# Patient Record
Sex: Male | Born: 1950 | Race: White | Hispanic: No | Marital: Married | Smoking: Never smoker
Health system: Southern US, Community
[De-identification: ages and names within clinical notes are randomized; demographics above are authoritative.]

## PROBLEM LIST (undated history)

## (undated) DIAGNOSIS — F32A Depression, unspecified: Secondary | ICD-10-CM

## (undated) DIAGNOSIS — F419 Anxiety disorder, unspecified: Secondary | ICD-10-CM

## (undated) DIAGNOSIS — I8393 Asymptomatic varicose veins of bilateral lower extremities: Secondary | ICD-10-CM

## (undated) DIAGNOSIS — C801 Malignant (primary) neoplasm, unspecified: Secondary | ICD-10-CM

## (undated) DIAGNOSIS — G709 Myoneural disorder, unspecified: Secondary | ICD-10-CM

## (undated) DIAGNOSIS — F329 Major depressive disorder, single episode, unspecified: Secondary | ICD-10-CM

## (undated) HISTORY — DX: Asymptomatic varicose veins of bilateral lower extremities: I83.93

## (undated) HISTORY — DX: Anxiety disorder, unspecified: F41.9

## (undated) HISTORY — DX: Major depressive disorder, single episode, unspecified: F32.9

## (undated) HISTORY — DX: Myoneural disorder, unspecified: G70.9

## (undated) HISTORY — DX: Malignant (primary) neoplasm, unspecified: C80.1

## (undated) HISTORY — DX: Depression, unspecified: F32.A

---

## 2015-08-07 ENCOUNTER — Ambulatory Visit: Payer: Self-pay | Admitting: Family Medicine

## 2015-08-11 ENCOUNTER — Encounter: Payer: Self-pay | Admitting: Family Medicine

## 2015-08-11 ENCOUNTER — Ambulatory Visit (INDEPENDENT_AMBULATORY_CARE_PROVIDER_SITE_OTHER): Payer: BC Managed Care – PPO | Admitting: Family Medicine

## 2015-08-11 VITALS — BP 140/88 | HR 65 | Ht 67.32 in | Wt 176.0 lb

## 2015-08-11 DIAGNOSIS — F32A Depression, unspecified: Secondary | ICD-10-CM | POA: Insufficient documentation

## 2015-08-11 DIAGNOSIS — F418 Other specified anxiety disorders: Secondary | ICD-10-CM

## 2015-08-11 DIAGNOSIS — N400 Enlarged prostate without lower urinary tract symptoms: Secondary | ICD-10-CM

## 2015-08-11 DIAGNOSIS — F329 Major depressive disorder, single episode, unspecified: Secondary | ICD-10-CM

## 2015-08-11 DIAGNOSIS — L819 Disorder of pigmentation, unspecified: Secondary | ICD-10-CM | POA: Diagnosis not present

## 2015-08-11 DIAGNOSIS — F419 Anxiety disorder, unspecified: Secondary | ICD-10-CM | POA: Insufficient documentation

## 2015-08-11 MED ORDER — ST JOHNS WORT 300 MG PO TABS: ORAL_TABLET | ORAL | Status: DC

## 2015-08-11 MED ORDER — TAMSULOSIN HCL 0.4 MG PO CAPS: 0.4000 mg | ORAL_CAPSULE | Freq: Every day | ORAL | Status: DC

## 2015-08-11 NOTE — Progress Notes (Signed)
CC: Marc Reynolds is a 65 y.o. male is here for Establish Care and Urinary Frequency   Subjective: HPI:  Very pleasant 73 old here to establish care.  Reports frustration with waking up 3 times a night to urinate. He has occasional urinary hesitancy that is worsened by Benadryl. He feels like he is urinating more than the average male his age. He denies dysuria or penile discharge. Symptoms have improved in the past with antibiotics but only for a few days.  He has a spot on his right medial thigh that he likely look at. It's been there for matter of years. It seems to be slowly enlarging and there are some blood vessels around the surface of the skin near by. He denies any pain or itching. No interventions as of yet. No one has examined this in the past. He denies any family history of skin cancer  He reports his felt anxious and depressed but is not really sure why. It's been present on a daily basis ever since he retired many months ago. Symptoms are worse when he doesn't have anything to occupy himself with. Symptoms are improved when reading the Bible. Symptoms are mild in severity but interfering with quality of life  Review of Systems - General ROS: negative for - chills, fever, night sweats, weight gain or weight loss Ophthalmic ROS: negative for - decreased vision Psychological ROS: negative for - anxiety or depression ENT ROS: negative for - hearing change, nasal congestion, tinnitus or allergies Hematological and Lymphatic ROS: negative for - bleeding problems, bruising or swollen lymph nodes Breast ROS: negative Respiratory ROS: no cough, shortness of breath, or wheezing Cardiovascular ROS: no chest pain or dyspnea on exertion Gastrointestinal ROS: no abdominal pain, change in bowel habits, or black or bloody stools Genito-Urinary ROS: negative for - genital discharge, genital ulcers, incontinence or abnormal bleeding from genitals Musculoskeletal ROS: negative for - joint pain or  muscle pain Neurological ROS: negative for - headaches or memory loss Dermatological ROS: negative for lumps, mole changes, rash and skin lesion changes  Past Medical History  Diagnosis Date  . Anxiety   . Depression     History reviewed. No pertinent past surgical history. Family History  Problem Relation Age of Onset  . Pancreatic cancer Mother   . Hypertension Mother   . Hypertension Father   . Skin cancer Sister     Social History   Social History  . Marital Status: Married    Spouse Name: N/A  . Number of Children: N/A  . Years of Education: N/A   Occupational History  . Not on file.   Social History Main Topics  . Smoking status: Never Smoker   . Smokeless tobacco: Never Used  . Alcohol Use: No  . Drug Use: No  . Sexual Activity: Not Currently   Other Topics Concern  . Not on file   Social History Narrative  . No narrative on file     Objective: BP 140/88 mmHg  Pulse 65  Ht 5' 7.32" (1.71 m)  Wt 176 lb (79.833 kg)  BMI 27.30 kg/m2  SpO2 98%  Vital signs reviewed. General: Alert and Oriented, No Acute Distress HEENT: Pupils equal, round, reactive to light. Conjunctivae clear.  External ears unremarkable.  Moist mucous membranes. Lungs: Clear and comfortable work of breathing, speaking in full sentences without accessory muscle use. Cardiac: Regular rate and rhythm.  Neuro: CN II-XII grossly intact, gait normal. Extremities: No peripheral edema.  Strong peripheral pulses.  Mental Status: No depression, anxiety, nor agitation. Logical though process. Skin: Warm and dry. On his right medial thigh he has a 6 mm diameter circular pigmented lesion which is flat however consists of a 3 separate shades pigment. In the center of this lesion it is somewhat pale.  Assessment & Plan: [name] was seen today for establish care and urinary frequency.  Diagnoses and all orders for this visit:  BPH (benign prostatic hyperplasia) -     tamsulosin (FLOMAX) 0.4 MG CAPS  capsule; Take 1 capsule (0.4 mg total) by mouth daily.  Pigmented skin lesion -     Ambulatory referral to Dermatology  Anxiety and depression -     St Johns Wort 300 MG TABS; One by mouth daily.   BPH: Uncontrolled chronic conditions start Flomax Pigmented skin lesion: I think that this warrants a dermatology referral Anxiety and depression: Begin St. John's wort.  Return in about 4 weeks (around 09/08/2015) for Wellness Exam.

## 2015-08-21 DIAGNOSIS — D2271 Melanocytic nevi of right lower limb, including hip: Secondary | ICD-10-CM | POA: Diagnosis not present

## 2015-08-21 DIAGNOSIS — D485 Neoplasm of uncertain behavior of skin: Secondary | ICD-10-CM | POA: Diagnosis not present

## 2015-09-08 ENCOUNTER — Ambulatory Visit (INDEPENDENT_AMBULATORY_CARE_PROVIDER_SITE_OTHER): Payer: Medicare Other | Admitting: Family Medicine

## 2015-09-08 ENCOUNTER — Encounter: Payer: Self-pay | Admitting: Family Medicine

## 2015-09-08 VITALS — BP 144/87 | HR 73 | Wt 177.0 lb

## 2015-09-08 DIAGNOSIS — Z0000 Encounter for general adult medical examination without abnormal findings: Secondary | ICD-10-CM

## 2015-09-08 DIAGNOSIS — R972 Elevated prostate specific antigen [PSA]: Secondary | ICD-10-CM | POA: Diagnosis not present

## 2015-09-08 DIAGNOSIS — D72829 Elevated white blood cell count, unspecified: Secondary | ICD-10-CM | POA: Diagnosis not present

## 2015-09-08 DIAGNOSIS — Z23 Encounter for immunization: Secondary | ICD-10-CM

## 2015-09-08 DIAGNOSIS — F418 Other specified anxiety disorders: Secondary | ICD-10-CM

## 2015-09-08 DIAGNOSIS — N400 Enlarged prostate without lower urinary tract symptoms: Secondary | ICD-10-CM

## 2015-09-08 DIAGNOSIS — F419 Anxiety disorder, unspecified: Secondary | ICD-10-CM

## 2015-09-08 DIAGNOSIS — L819 Disorder of pigmentation, unspecified: Secondary | ICD-10-CM

## 2015-09-08 DIAGNOSIS — E782 Mixed hyperlipidemia: Secondary | ICD-10-CM | POA: Diagnosis not present

## 2015-09-08 DIAGNOSIS — F329 Major depressive disorder, single episode, unspecified: Secondary | ICD-10-CM

## 2015-09-08 DIAGNOSIS — F32A Depression, unspecified: Secondary | ICD-10-CM

## 2015-09-08 LAB — CBC
HCT: 43.3 % (ref 39.0–52.0)
Hemoglobin: 14.4 g/dL (ref 13.0–17.0)
MCH: 29.1 pg (ref 26.0–34.0)
MCHC: 33.3 g/dL (ref 30.0–36.0)
MCV: 87.7 fL (ref 78.0–100.0)
MPV: 9.4 fL (ref 8.6–12.4)
PLATELETS: 257 10*3/uL (ref 150–400)
RBC: 4.94 MIL/uL (ref 4.22–5.81)
RDW: 13.6 % (ref 11.5–15.5)
WBC: 14 10*3/uL — AB (ref 4.0–10.5)

## 2015-09-08 NOTE — Progress Notes (Signed)
CC: Marc Reynolds is a 65 y.o. male is here for Medicare Wellness   Subjective: HPI:  Colonoscopy: He's intimidated by the idea of a colonoscopy and states he'll never get this, he's' open to the idea of cologuard though, order has been placed. Prostate: Discussed screening risks/beneifts with patient today, he is open to the idea of a PSA  being obtained.  Influenza Vaccine: Declined Pneumovax: He left before he could receive this, we'll readdress at follow-up visit Td/Tdap: Needs it today Zoster: he's not sure if he is ever received this, requesting outside records  Requesting the physical exam telling me that his urinary frequency in the evening is now absent. He denies any side effects from Flomax.  Review of Systems - General ROS: negative for - chills, fever, night sweats, weight gain or weight loss Ophthalmic ROS: negative for - decreased vision Psychological ROS: negative for - anxiety or depression ENT ROS: negative for - hearing change, nasal congestion, tinnitus or allergies Hematological and Lymphatic ROS: negative for - bleeding problems, bruising or swollen lymph nodes Breast ROS: negative Respiratory ROS: no cough, shortness of breath, or wheezing Cardiovascular ROS: no chest pain or dyspnea on exertion Gastrointestinal ROS: no abdominal pain, change in bowel habits, or black or bloody stools Genito-Urinary ROS: negative for - genital discharge, genital ulcers, incontinence or abnormal bleeding from genitals Musculoskeletal ROS: negative for - joint pain or muscle pain Neurological ROS: negative for - headaches or memory loss Dermatological ROS: negative for lumps, mole changes, rash and skin lesion changes  Past Medical History  Diagnosis Date  . Anxiety   . Depression     No past surgical history on file. Family History  Problem Relation Age of Onset  . Pancreatic cancer Mother   . Hypertension Mother   . Hypertension Father   . Skin cancer Sister     Social  History   Social History  . Marital Status: Married    Spouse Name: N/A  . Number of Children: N/A  . Years of Education: N/A   Occupational History  . Not on file.   Social History Main Topics  . Smoking status: Never Smoker   . Smokeless tobacco: Never Used  . Alcohol Use: No  . Drug Use: No  . Sexual Activity: Not Currently   Other Topics Concern  . Not on file   Social History Narrative  . No narrative on file     Objective: BP 144/87 mmHg  Pulse 73  Wt 177 lb (80.287 kg)  General: No Acute Distress HEENT: Atraumatic, normocephalic, conjunctivae normal without scleral icterus.  No nasal discharge, hearing grossly intact, TMs with good landmarks bilaterally with no middle ear abnormalities, posterior pharynx clear without oral lesions. Neck: Supple, trachea midline, no cervical nor supraclavicular adenopathy. Pulmonary: Clear to auscultation bilaterally without wheezing, rhonchi, nor rales. Cardiac: Regular rate and rhythm.  No murmurs, rubs, nor gallops. No peripheral edema.  2+ peripheral pulses bilaterally. Abdomen: Bowel sounds normal.  No masses.  Non-tender without rebound.  Negative Murphy's sign. MSK: Grossly intact, no signs of weakness.  Full strength throughout upper and lower extremities.  Full ROM in upper and lower extremities.  No midline spinal tenderness. Neuro: Gait unremarkable, CN II-XII grossly intact.  C5-C6 Reflex 2/4 Bilaterally, L4 Reflex 2/4 Bilaterally.  Cerebellar function intact. Skin: No rashes. Psych: Alert and oriented to person/place/time.  Thought process normal. No anxiety/depression. Assessment & Plan: Marieo was seen today for medicare wellness.  Diagnoses and all orders for  this visit:  Routine history and physical examination of adult -     COMPLETE METABOLIC PANEL WITH GFR -     PSA -     CBC -     Lipid panel -     Tdap vaccine greater than or equal to 7yo IM  BPH (benign prostatic hyperplasia)  Anxiety and  depression  Pigmented skin lesion   Healthy lifestyle interventions including but not limited to regular exercise, a healthy low fat diet, moderation of salt intake, the dangers of tobacco/alcohol/recreational drug use, nutrition supplementation, and accident avoidance were discussed with the patient and a handout was provided for future reference.    No Follow-up on file.

## 2015-09-09 LAB — PSA: PSA: 4.85 ng/mL — AB (ref <=4.00)

## 2015-09-11 ENCOUNTER — Telehealth: Payer: Self-pay | Admitting: Family Medicine

## 2015-09-11 DIAGNOSIS — R972 Elevated prostate specific antigen [PSA]: Secondary | ICD-10-CM

## 2015-09-11 DIAGNOSIS — D72829 Elevated white blood cell count, unspecified: Secondary | ICD-10-CM

## 2015-09-11 DIAGNOSIS — E781 Pure hyperglyceridemia: Secondary | ICD-10-CM | POA: Insufficient documentation

## 2015-09-11 MED ORDER — ICOSAPENT ETHYL 1 G PO CAPS: ORAL_CAPSULE | ORAL | Status: DC

## 2015-09-11 NOTE — Telephone Encounter (Signed)
He stated that it's been several years since his PSA been checked.

## 2015-09-11 NOTE — Telephone Encounter (Signed)
Yes in the filing cabinet however since he has medicare he might not be eligible to use the card.

## 2015-09-11 NOTE — Telephone Encounter (Signed)
Pt notified.  Do we have any savings cards for vascepa?

## 2015-09-11 NOTE — Telephone Encounter (Signed)
Pt.notified

## 2015-09-11 NOTE — Telephone Encounter (Signed)
All right then a repeat PSA is needed to see if it's remaining elevated or if this was an outlier. Another lab slip will be placed up front and this can be repeated when his WBC gets rechecked.

## 2015-09-11 NOTE — Telephone Encounter (Signed)
Will you please let patient know that his blood sugar, kidney function, liver function and hemoglobin were normal.  There were three things that need attention. -His PSA was mildly elevated at 4.85, I'd like to compare these to prior values. Can he please let me know if any other office has checked this in the last 3 years and if so can records be requested. -His triglycerides were elevated to a degree that can one day cause pancreatitis or hepatitis. I'd recommend he switch from taking an OTC fish oil capsule to taking a pharmaceutical grade fish oil called vascepa that I've sent to his [facility]. -His white blood cell count was elevated and usually this only occurs during an infection which is confusing since he was feeling normal.  I'd recommend he have this rechecked in our lab later this week, I'll put a lab slip up front.

## 2015-09-12 NOTE — Telephone Encounter (Signed)
Pt.notified

## 2015-09-13 LAB — COMPLETE METABOLIC PANEL WITH GFR
ALBUMIN: 4.2 g/dL (ref 3.6–5.1)
ALK PHOS: 72 U/L (ref 40–115)
ALT: 13 U/L (ref 9–46)
AST: 15 U/L (ref 10–35)
BILIRUBIN TOTAL: 0.4 mg/dL (ref 0.2–1.2)
BUN: 13 mg/dL (ref 7–25)
CALCIUM: 9.5 mg/dL (ref 8.6–10.3)
CO2: 28 mmol/L (ref 20–31)
Chloride: 99 mmol/L (ref 98–110)
Creat: 0.85 mg/dL (ref 0.70–1.25)
Glucose, Bld: 94 mg/dL (ref 65–99)
POTASSIUM: 5 mmol/L (ref 3.5–5.3)
SODIUM: 139 mmol/L (ref 135–146)
TOTAL PROTEIN: 6.6 g/dL (ref 6.1–8.1)

## 2015-09-13 LAB — LIPID PANEL
Cholesterol: 167 mg/dL (ref 125–200)
HDL: 30 mg/dL — ABNORMAL LOW (ref >=40)
Total CHOL/HDL Ratio: 5.6 Ratio — ABNORMAL HIGH (ref <=5.0)
Triglycerides: 222 mg/dL — ABNORMAL HIGH (ref <150)

## 2015-09-25 DIAGNOSIS — Z1212 Encounter for screening for malignant neoplasm of rectum: Secondary | ICD-10-CM | POA: Diagnosis not present

## 2015-09-25 DIAGNOSIS — Z1211 Encounter for screening for malignant neoplasm of colon: Secondary | ICD-10-CM | POA: Diagnosis not present

## 2015-10-04 LAB — COLOGUARD: Cologuard: NEGATIVE

## 2015-10-05 ENCOUNTER — Telehealth: Payer: Self-pay | Admitting: Family Medicine

## 2015-10-05 NOTE — Telephone Encounter (Signed)
Will you please let patient know that his cologuard test was normal and reassuring, no signs of precancerous lesions or colon cancer. I'd recommend rechecking this in three years.

## 2015-10-10 ENCOUNTER — Encounter: Payer: Self-pay | Admitting: Family Medicine

## 2015-10-11 ENCOUNTER — Telehealth: Payer: Self-pay

## 2015-10-11 NOTE — Telephone Encounter (Signed)
Pt.notified

## 2015-10-11 NOTE — Telephone Encounter (Signed)
This proposal sounds reasonable given that his insurance isn't willing to provide better coverage of vascepa.  I think it's worth him taking a single dose of this other omega acid supplement twice a day and return for an office visit in June

## 2015-10-11 NOTE — Telephone Encounter (Signed)
Is there a cheaper Rx fish oil that can be sent in for pt.  He stated that it cost him $70 a month with a discount card. He was told that vascepa is only Rx when triglycerides are over 500. He seen some OTC such as omegaVia EPA 500 and wants know could this go in the place of vascepa? Please advise.

## 2015-11-11 ENCOUNTER — Other Ambulatory Visit: Payer: Self-pay | Admitting: Family Medicine

## 2016-02-14 ENCOUNTER — Other Ambulatory Visit: Payer: Self-pay | Admitting: Family Medicine

## 2016-04-24 ENCOUNTER — Ambulatory Visit (INDEPENDENT_AMBULATORY_CARE_PROVIDER_SITE_OTHER): Payer: Medicare Other | Admitting: Osteopathic Medicine

## 2016-04-24 ENCOUNTER — Encounter: Payer: Self-pay | Admitting: Osteopathic Medicine

## 2016-04-24 VITALS — BP 123/75 | HR 82 | Ht 68.5 in | Wt 176.0 lb

## 2016-04-24 DIAGNOSIS — R21 Rash and other nonspecific skin eruption: Secondary | ICD-10-CM

## 2016-04-24 LAB — CBC WITH DIFFERENTIAL/PLATELET
Basophils Absolute: 0 cells/uL (ref 0–200)
Basophils Relative: 0 %
Eosinophils Absolute: 605 cells/uL — ABNORMAL HIGH (ref 15–500)
Eosinophils Relative: 5 %
HCT: 42.4 % (ref 38.5–50.0)
Hemoglobin: 14 g/dL (ref 13.2–17.1)
Lymphocytes Relative: 15 %
Lymphs Abs: 1815 cells/uL (ref 850–3900)
MCH: 29.2 pg (ref 27.0–33.0)
MCHC: 33 g/dL (ref 32.0–36.0)
MCV: 88.5 fL (ref 80.0–100.0)
MPV: 9.1 fL (ref 7.5–12.5)
Monocytes Absolute: 847 cells/uL (ref 200–950)
Monocytes Relative: 7 %
Neutro Abs: 8833 cells/uL — ABNORMAL HIGH (ref 1500–7800)
Neutrophils Relative %: 73 %
Platelets: 212 10*3/uL (ref 140–400)
RBC: 4.79 MIL/uL (ref 4.20–5.80)
RDW: 13.7 % (ref 11.0–15.0)
WBC: 12.1 10*3/uL — ABNORMAL HIGH (ref 3.8–10.8)

## 2016-04-24 NOTE — Progress Notes (Signed)
HPI: Marc Reynolds is a 65 y.o. male  who presents to [place] today, 04/24/16,  for chief complaint of:  Chief Complaint  Patient presents with  . Establish Care    . Context:no injury or known exposure . Location: L leg worst, starting a bit on R . Quality: red spots, itching, some mild swelling and peeling of skin . Duration: 10 days . Modifying factors: home remedies, essential oils, does not desire steroid treatment . Assoc signs/symptoms: no fever    Past medical, surgical, social and family history reviewed: Past Medical History:  Diagnosis Date  . Anxiety   . Depression    No past surgical history on file. Social History  Substance Use Topics  . Smoking status: Never Smoker  . Smokeless tobacco: Never Used  . Alcohol use No   Family History  Problem Relation Age of Onset  . Pancreatic cancer Mother   . Hypertension Mother   . Hypertension Father   . Skin cancer Sister      Current medication list and allergy/intolerance information reviewed:   Current Outpatient Prescriptions on File Prior to Visit  Medication Sig Dispense Refill  . Garlic [id number] MG TABS Take by mouth.    Marland Kitchen HESPERIDIN-DIOSMIN PO Take by mouth.    . Multiple Vitamins-Minerals (MULTIVITAMIN PO) Take by mouth.    . Omega-3 Fatty Acids (FISH OIL OMEGA-3 PO) Take by mouth.    . Probiotic Product (PROBIOTIC PO) Take by mouth.    Francella Solian Johns Wort 300 MG TABS One by mouth daily. 90 each 0  . tamsulosin (FLOMAX) 0.4 MG CAPS capsule Take 1 capsule (0.4 mg total) by mouth daily. NEED FOLLOW UP APPOINTMENT FOR MORE REFILLS 30 capsule 0   No current facility-administered medications on file prior to visit.    No Known Allergies    Review of Systems:  Constitutional: No recent illness  HEENT: No  headache, no vision change  Cardiac: No  chest pain  Respiratory:  No  shortness of breath.   Gastrointestinal: No  abdominal pain  Musculoskeletal: No new  myalgia/arthralgia, mild R knee pain ?arthritis  Skin: +Rash  Hem/Onc: No  easy bruising/bleeding, No  abnormal lumps/bumps. +varicose veins  Neurologic: No  weakness, No  Dizziness   Exam:  BP 123/75   Pulse 82   Ht 5' 8.5" (1.74 m)   Wt 176 lb (79.8 kg)   BMI 26.37 kg/m   Constitutional: VS see above. General Appearance: alert, well-developed, well-nourished, NAD  Eyes: Normal lids and conjunctive, non-icteric sclera  Ears, Nose, Mouth, Throat: MMM, Normal external inspection ears/nares/mouth/lips/gums.  Neck: No masses, trachea midline.   Respiratory: Normal respiratory effort. no wheeze, no rhonchi, no rales  Musculoskeletal: Gait normal. Symmetric and independent movement of all extremities  Neurological: Normal balance/coordination. No tremor.  Skin: warm, dry, intact. Rash (+)blancing, mild edema on L as well, vasculitis vs folliculitis  Psychiatric: Normal judgment/insight. Normal mood and affect. Oriented x3.   L leg   R leg    No results found for this or any previous visit (from the past 72 hour(s)).  No results found.   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   Rash and nonspecific skin eruption - Plan: CBC with Differential/Platelet, COMPLETE METABOLIC PANEL WITH GFR, Sedimentation rate, Ambulatory referral to Dermatology    Patient Instructions  Labs for now and can try OTC topical  steroids or Benadryl for itching if needed. If no better in 2 days, let's follow-up for biopsy and discuss further treatment.     Visit summary with medication list and pertinent instructions was printed for patient to review. All questions at time of visit were answered - patient instructed to contact office with any additional concerns. ER/RTC precautions were reviewed with the patient. Follow-up plan: Return in about 2 days (around 04/26/2016) for recheck skin, possible  biopsy 8:00 Friday 04/26/16.

## 2016-04-24 NOTE — Patient Instructions (Addendum)
Labs for now and can try OTC topical steroids or Benadryl for itching if needed. If no better in 2 days, let's follow-up for biopsy and discuss further treatment.

## 2016-04-25 ENCOUNTER — Ambulatory Visit: Payer: Medicare Other | Admitting: Osteopathic Medicine

## 2016-04-25 DIAGNOSIS — L270 Generalized skin eruption due to drugs and medicaments taken internally: Secondary | ICD-10-CM | POA: Diagnosis not present

## 2016-04-25 LAB — COMPLETE METABOLIC PANEL WITH GFR
ALBUMIN: 4.1 g/dL (ref 3.6–5.1)
ALK PHOS: 75 U/L (ref 40–115)
ALT: 19 U/L (ref 9–46)
AST: 19 U/L (ref 10–35)
BUN: 24 mg/dL (ref 7–25)
CALCIUM: 9.2 mg/dL (ref 8.6–10.3)
CHLORIDE: 102 mmol/L (ref 98–110)
CO2: 31 mmol/L (ref 20–31)
CREATININE: 1.35 mg/dL — AB (ref 0.70–1.25)
GFR, Est African American: 63 mL/min (ref >=60)
GFR, Est Non African American: 55 mL/min — ABNORMAL LOW (ref >=60)
Glucose, Bld: 86 mg/dL (ref 65–99)
Potassium: 4.7 mmol/L (ref 3.5–5.3)
Sodium: 139 mmol/L (ref 135–146)
Total Bilirubin: 0.4 mg/dL (ref 0.2–1.2)
Total Protein: 6.1 g/dL (ref 6.1–8.1)

## 2016-04-25 LAB — SEDIMENTATION RATE: SED RATE: 1 mm/h (ref 0–20)

## 2017-06-26 DIAGNOSIS — R69 Illness, unspecified: Secondary | ICD-10-CM | POA: Diagnosis not present

## 2017-10-07 DIAGNOSIS — H527 Unspecified disorder of refraction: Secondary | ICD-10-CM | POA: Diagnosis not present

## 2017-10-07 DIAGNOSIS — H25813 Combined forms of age-related cataract, bilateral: Secondary | ICD-10-CM | POA: Diagnosis not present

## 2017-12-29 DIAGNOSIS — R69 Illness, unspecified: Secondary | ICD-10-CM | POA: Diagnosis not present

## 2018-05-01 DIAGNOSIS — G20C Parkinsonism, unspecified: Secondary | ICD-10-CM | POA: Insufficient documentation

## 2018-05-01 DIAGNOSIS — G20 Parkinson's disease: Secondary | ICD-10-CM | POA: Insufficient documentation

## 2018-07-15 DIAGNOSIS — R69 Illness, unspecified: Secondary | ICD-10-CM | POA: Diagnosis not present

## 2018-10-27 DIAGNOSIS — H524 Presbyopia: Secondary | ICD-10-CM | POA: Diagnosis not present

## 2018-10-27 DIAGNOSIS — H25813 Combined forms of age-related cataract, bilateral: Secondary | ICD-10-CM | POA: Diagnosis not present

## 2019-01-28 DIAGNOSIS — R69 Illness, unspecified: Secondary | ICD-10-CM | POA: Diagnosis not present

## 2019-06-06 ENCOUNTER — Emergency Department (INDEPENDENT_AMBULATORY_CARE_PROVIDER_SITE_OTHER)
Admission: EM | Admit: 2019-06-06 | Discharge: 2019-06-06 | Disposition: A | Discharge status: Home / Self Care | Payer: Medicare HMO | Source: Home / Self Care | Attending: Family Medicine | Admitting: Family Medicine

## 2019-06-06 ENCOUNTER — Encounter: Payer: Self-pay | Admitting: Emergency Medicine

## 2019-06-06 ENCOUNTER — Other Ambulatory Visit: Payer: Self-pay

## 2019-06-06 DIAGNOSIS — R5081 Fever presenting with conditions classified elsewhere: Secondary | ICD-10-CM

## 2019-06-06 DIAGNOSIS — J069 Acute upper respiratory infection, unspecified: Secondary | ICD-10-CM

## 2019-06-06 NOTE — ED Provider Notes (Signed)
[facility]    CSN: [id number] Arrival date & time: 06/06/19  1054      History   Chief Complaint Chief Complaint  Patient presents with  . Nasal Congestion  . Sore Throat    irritated    HPI Marc Reynolds is a 68 y.o. male.   Patient complains of three day history of typical cold-like symptoms developing over several days, including mild sore throat, low grade fever, sinus congestion, headache, fatigue, myalgias, and cough.  He denies chest tightness, shortness of breath, and changes in taste/smell.    The history is provided by the patient.    Past Medical History:  Diagnosis Date  . Anxiety   . Depression     Patient Active Problem List   Diagnosis Date Noted  . Hypertriglyceridemia 09/11/2015  . Leukocytosis 09/11/2015  . Anxiety and depression 08/11/2015  . Pigmented skin lesion 08/11/2015  . BPH (benign prostatic hyperplasia) 08/11/2015    History reviewed. No pertinent surgical history.     Home Medications    Prior to Admission medications   Medication Sig Start Date End Date Taking? Authorizing Provider  Garlic [id number] MG TABS Take by mouth.    [provider]  HESPERIDIN-DIOSMIN PO Take by mouth.    [provider]  Multiple Vitamins-Minerals (MULTIVITAMIN PO) Take by mouth.    [provider]  Omega-3 Fatty Acids (FISH OIL OMEGA-3 PO) Take by mouth.    [provider]  Probiotic Product (PROBIOTIC PO) Take by mouth.    [provider]  [facility] Wort 300 MG TABS One by mouth daily. 08/11/15   Marcial Pacas, DO    Family History Family History  Problem Relation Age of Onset  . Pancreatic cancer Mother   . Hypertension Mother   . Hypertension Father   . Skin cancer Sister     Social History Social History   Tobacco Use  . Smoking status: Never Smoker  . Smokeless tobacco: Never Used  Substance Use Topics  . Alcohol use: No  . Drug use: No     Allergies   Patient has no known  allergies.   Review of Systems Review of Systems + sore throat + cough No pleuritic pain No wheezing + nasal congestion + post-nasal drainage No sinus pain/pressure No itchy/red eyes No earache No hemoptysis No SOB + low grade fever, + chills No nausea No vomiting No abdominal pain No diarrhea No urinary symptoms No skin rash + fatigue No myalgias + headache Used OTC meds without relief   Physical Exam Triage Vital Signs ED Triage Vitals [06/06/19 1142]  Enc Vitals Group     BP 138/79     Pulse Rate 62     Resp 18     Temp 98 F (36.7 C)     Temp Source Oral     SpO2 99 %     Weight 172 lb (78 kg)     Height 5\' 8"  (1.727 m)     Head Circumference      Peak Flow      Pain Score 0     Pain Loc      Pain Edu?      Excl. in [place]?    No data found.  Updated Vital Signs BP 138/79 (BP Location: Right Arm)   Pulse 62   Temp 98 F (36.7 C) (Oral)   Resp 18   Ht 5\' 8"  (1.727 m)   Wt 78 kg  SpO2 99%   BMI 26.15 kg/m   Visual Acuity Right Eye Distance:   Left Eye Distance:   Bilateral Distance:    Right Eye Near:   Left Eye Near:    Bilateral Near:     Physical Exam Nursing notes and Vital Signs reviewed. Appearance:  Patient appears stated age, and in no acute distress Eyes:  Pupils are equal, round, and reactive to light and accomodation.  Extraocular movement is intact.  Conjunctivae are not inflamed  Ears:  Canals normal.  Tympanic membranes normal.  Nose:  Mildly congested turbinates.  No sinus tenderness.   Pharynx:  Normal Neck:  Supple.  Mildly enlarged nontender lateral nodes are present   Lungs:  Clear to auscultation.  Breath sounds are equal.  Moving air well. Heart:  Regular rate and rhythm without murmurs, rubs, or gallops.  Abdomen:  Nontender without masses or hepatosplenomegaly.  Bowel sounds are present.  No CVA or flank tenderness.  Extremities:  No edema.  Skin:  No rash present.   UC Treatments / Results  Labs (all labs  ordered are listed, but only abnormal results are displayed) Labs Reviewed  SARS-COV-2 RNA,(COVID-19) QUALITATIVE NAAT    EKG   Radiology No results found.  Procedures Procedures (including critical care time)  Medications Ordered in UC Medications - No data to display  Initial Impression / Assessment and Plan / UC Course  I have reviewed the triage vital signs and the nursing notes.  Pertinent labs & imaging results that were available during my care of the patient were reviewed by me and considered in my medical decision making (see chart for details).    Benign exam.  Treat symptomatically for now    Final Clinical Impressions(s) / UC Diagnoses   Final diagnoses:  Fever in other diseases  Viral URI with cough     Discharge Instructions     Isolate yourself until COVID-19 test result is available.   If your COVID19 test is positive, then you are infected with the novel coronavirus and could give the virus to others.  Please continue isolation at home for at least 10 days since the start of your symptoms.  Once you complete your 10 day quarantine, you may return to normal activities as long as you've not had a fever for over 24 hours (without taking fever reducing medicine) and your symptoms are improving. Please continue good preventive care measures, including:  frequent hand-washing, avoid touching your face, cover coughs/sneezes, stay out of crowds and keep a 6 foot distance from others.  Go to the nearest hospital emergency room if fever/cough/breathlessness are severe or illness seems like a threat to life.   Take plain guaifenesin (1200mg  extended release tabs such as Mucinex) twice daily, with plenty of water, for cough and congestion.  May add Pseudoephedrine (30mg , one or two every 4 to 6 hours) for sinus congestion.  Get adequate rest.   May take Delsym Cough Suppressant at bedtime for nighttime cough.  Try warm salt water gargles for sore throat.  Stop all  antihistamines for now, and other non-prescription cough/cold preparations. May take Ibuprofen 200mg , 4 tabs every 8 hours with food for body aches, headache, etc.      ED Prescriptions    None        Kandra Nicolas, MD 06/16/19 1609

## 2019-06-06 NOTE — ED Triage Notes (Signed)
Patient reports onset of congestion with low grade fever 3 days ago; no fever today; scratchy throat. No known exposure to known covid pos person, nor has he travelled. He has not had influenza vacc this season.

## 2019-06-06 NOTE — Discharge Instructions (Addendum)
Isolate yourself until COVID-19 test result is available.   If your COVID19 test is positive, then you are infected with the novel coronavirus and could give the virus to others.  Please continue isolation at home for at least 10 days since the start of your symptoms.  Once you complete your 10 day quarantine, you may return to normal activities as long as you've not had a fever for over 24 hours (without taking fever reducing medicine) and your symptoms are improving. Please continue good preventive care measures, including:  frequent hand-washing, avoid touching your face, cover coughs/sneezes, stay out of crowds and keep a 6 foot distance from others.  Go to the nearest hospital emergency room if fever/cough/breathlessness are severe or illness seems like a threat to life.   Take plain guaifenesin (1200mg  extended release tabs such as Mucinex) twice daily, with plenty of water, for cough and congestion.  May add Pseudoephedrine (30mg , one or two every 4 to 6 hours) for sinus congestion.  Get adequate rest.   May take Delsym Cough Suppressant at bedtime for nighttime cough.  Try warm salt water gargles for sore throat.  Stop all antihistamines for now, and other non-prescription cough/cold preparations. May take Ibuprofen 200mg , 4 tabs every 8 hours with food for body aches, headache, etc.

## 2019-06-08 ENCOUNTER — Telehealth: Payer: Self-pay | Admitting: Emergency Medicine

## 2019-06-08 LAB — SARS-COV-2 RNA,(COVID-19) QUALITATIVE NAAT: SARS CoV2 RNA: DETECTED — AB

## 2019-06-08 NOTE — Telephone Encounter (Signed)

## 2019-08-18 DIAGNOSIS — R69 Illness, unspecified: Secondary | ICD-10-CM | POA: Diagnosis not present

## 2019-11-02 DIAGNOSIS — H25813 Combined forms of age-related cataract, bilateral: Secondary | ICD-10-CM | POA: Diagnosis not present

## 2019-11-02 DIAGNOSIS — H524 Presbyopia: Secondary | ICD-10-CM | POA: Diagnosis not present

## 2019-12-14 ENCOUNTER — Ambulatory Visit (INDEPENDENT_AMBULATORY_CARE_PROVIDER_SITE_OTHER): Payer: Medicare HMO | Admitting: Family Medicine

## 2019-12-14 ENCOUNTER — Encounter: Payer: Self-pay | Admitting: Family Medicine

## 2019-12-14 ENCOUNTER — Other Ambulatory Visit: Payer: Self-pay

## 2019-12-14 VITALS — BP 140/82 | HR 65 | Temp 97.5°F | Ht 68.5 in | Wt 176.6 lb

## 2019-12-14 DIAGNOSIS — Z1211 Encounter for screening for malignant neoplasm of colon: Secondary | ICD-10-CM

## 2019-12-14 DIAGNOSIS — G252 Other specified forms of tremor: Secondary | ICD-10-CM

## 2019-12-14 DIAGNOSIS — Z23 Encounter for immunization: Secondary | ICD-10-CM

## 2019-12-14 DIAGNOSIS — Z13220 Encounter for screening for lipoid disorders: Secondary | ICD-10-CM | POA: Diagnosis not present

## 2019-12-14 DIAGNOSIS — Z0000 Encounter for general adult medical examination without abnormal findings: Secondary | ICD-10-CM

## 2019-12-14 DIAGNOSIS — E781 Pure hyperglyceridemia: Secondary | ICD-10-CM | POA: Diagnosis not present

## 2019-12-14 DIAGNOSIS — R269 Unspecified abnormalities of gait and mobility: Secondary | ICD-10-CM | POA: Diagnosis not present

## 2019-12-14 NOTE — Assessment & Plan Note (Signed)
Well adult Orders Placed This Encounter  Procedures  . Cologuard  . COMPLETE METABOLIC PANEL WITH GFR  . CBC  . Lipid Profile  . TSH  . Ambulatory referral to Neurology    Referral Priority:   Routine    Referral Type:   Consultation    Referral Reason:   Specialty Services Required    Requested Specialty:   Neurology    Number of Visits Requested:   1  Screening: Lipid, cologuard Immunizations: Prevnar-13 Anticipatory guidance:  Recommendations per AVS

## 2019-12-14 NOTE — Addendum Note (Signed)
Addended by: Peggye Ley on: 12/14/2019 10:18 AM   Modules accepted: Orders

## 2019-12-14 NOTE — Patient Instructions (Signed)
Great to meet you today! Please have labs completed.  I am concerned your tremor and symptoms may be signs of Parkinson's disease.  I have entered a referral to neurology.     Preventive Care 69 Years and Older, Male Preventive care refers to lifestyle choices and visits with your health care provider that can promote health and wellness. This includes:  A yearly physical exam. This is also called an annual well check.  Regular dental and eye exams.  Immunizations.  Screening for certain conditions.  Healthy lifestyle choices, such as diet and exercise. What can I expect for my preventive care visit? Physical exam Your health care provider will check:  Height and weight. These may be used to calculate body mass index (BMI), which is a measurement that tells if you are at a healthy weight.  Heart rate and blood pressure.  Your skin for abnormal spots. Counseling Your health care provider may ask you questions about:  Alcohol, tobacco, and drug use.  Emotional well-being.  Home and relationship well-being.  Sexual activity.  Eating habits.  History of falls.  Memory and ability to understand (cognition).  Work and work Statistician. What immunizations do I need?  Influenza (flu) vaccine  This is recommended every year. Tetanus, diphtheria, and pertussis (Tdap) vaccine  You may need a Td booster every 10 years. Varicella (chickenpox) vaccine  You may need this vaccine if you have not already been vaccinated. Zoster (shingles) vaccine  You may need this after age 69. Pneumococcal conjugate (PCV13) vaccine  One dose is recommended after age 8. Pneumococcal polysaccharide (PPSV23) vaccine  One dose is recommended after age 80. Measles, mumps, and rubella (MMR) vaccine  You may need at least one dose of MMR if you were born in 1957 or later. You may also need a second dose. Meningococcal conjugate (MenACWY) vaccine  You may need this if you have certain  conditions. Hepatitis A vaccine  You may need this if you have certain conditions or if you travel or work in places where you may be exposed to hepatitis A. Hepatitis B vaccine  You may need this if you have certain conditions or if you travel or work in places where you may be exposed to hepatitis B. Haemophilus influenzae type b (Hib) vaccine  You may need this if you have certain conditions. You may receive vaccines as individual doses or as more than one vaccine together in one shot (combination vaccines). Talk with your health care provider about the risks and benefits of combination vaccines. What tests do I need? Blood tests  Lipid and cholesterol levels. These may be checked every 5 years, or more frequently depending on your overall health.  Hepatitis C test.  Hepatitis B test. Screening  Lung cancer screening. You may have this screening every year starting at age 23 if you have a 30-pack-year history of smoking and currently smoke or have quit within the past 15 years.  Colorectal cancer screening. All adults should have this screening starting at age 34 and continuing until age 49. Your health care provider may recommend screening at age 55 if you are at increased risk. You will have tests every 1-10 years, depending on your results and the type of screening test.  Prostate cancer screening. Recommendations will vary depending on your family history and other risks.  Diabetes screening. This is done by checking your blood sugar (glucose) after you have not eaten for a while (fasting). You may have this done every 1-3  years.  Abdominal aortic aneurysm (AAA) screening. You may need this if you are a current or former smoker.  Sexually transmitted disease (STD) testing. Follow these instructions at home: Eating and drinking  Eat a diet that includes fresh fruits and vegetables, whole grains, lean protein, and low-fat dairy products. Limit your intake of foods with high  amounts of sugar, saturated fats, and salt.  Take vitamin and mineral supplements as recommended by your health care provider.  Do not drink alcohol if your health care provider tells you not to drink.  If you drink alcohol: ? Limit how much you have to 0-2 drinks a day. ? Be aware of how much alcohol is in your drink. In the [place] one drink equals one 12 oz bottle of beer (355 mL), one 5 oz glass of wine (148 mL), or one 1 oz glass of hard liquor (44 mL). Lifestyle  Take daily care of your teeth and gums.  Stay active. Exercise for at least 30 minutes on 5 or more days each week.  Do not use any products that contain nicotine or tobacco, such as cigarettes, e-cigarettes, and chewing tobacco. If you need help quitting, ask your health care provider.  If you are sexually active, practice safe sex. Use a condom or other form of protection to prevent STIs (sexually transmitted infections).  Talk with your health care provider about taking a low-dose aspirin or statin. What's next?  Visit your health care provider once a year for a well check visit.  Ask your health care provider how often you should have your eyes and teeth checked.  Stay up to date on all vaccines. This information is not intended to replace advice given to you by your health care provider. Make sure you discuss any questions you have with your health care provider. Document Revised: 05/28/2018 Document Reviewed: 05/28/2018 Elsevier Patient Education  2020 [employer].

## 2019-12-14 NOTE — Progress Notes (Signed)
Marc Reynolds - 69 y.o. male MRN [id number]  Date of birth: 1950-11-22  Subjective Chief Complaint  Patient presents with  . Annual Exam  . Tremors    HPI Marc Reynolds is a 69 y.o. male here today for annual exam.  He has been in pretty good health until recently.  He reports that over the past several months he has started having tremor with some mild depressive symptoms and gait instability.  He has had a couple of falls due to gait.  He denies a family history of Parkinson's disease.    He is due for updated cologuard.  He also needs pneumonia vaccine.    He is a non-smoker.  He consumes a couple of servings of EtOH per week.    He has an eye doctor, exam is up to date.    Review of Systems  Constitutional: Negative for chills, fever, malaise/fatigue and weight loss.  HENT: Negative for congestion, ear pain and sore throat.   Eyes: Negative for blurred vision, double vision and pain.  Respiratory: Negative for cough and shortness of breath.   Cardiovascular: Negative for chest pain and palpitations.  Gastrointestinal: Negative for abdominal pain, blood in stool, constipation, heartburn and nausea.  Genitourinary: Negative for dysuria and urgency.  Musculoskeletal: Negative for joint pain and myalgias.  Neurological: Positive for tremors. Negative for dizziness and headaches.  Endo/Heme/Allergies: Does not bruise/bleed easily.  Psychiatric/Behavioral: Negative for depression. The patient is not nervous/anxious and does not have insomnia.     No Known Allergies  Past Medical History:  Diagnosis Date  . Anxiety   . Depression   . Varicose veins of legs     History reviewed. No pertinent surgical history.  Social History   Socioeconomic History  . Marital status: Married    Spouse name: Not on file  . Number of children: Not on file  . Years of education: Not on file  . Highest education level: Not on file  Occupational History  . Occupation: Retired  Tobacco Use  .  Smoking status: Never Smoker  . Smokeless tobacco: Never Used  Vaping Use  . Vaping Use: Never used  Substance and Sexual Activity  . Alcohol use: Yes    Alcohol/week: 2.0 standard drinks    Types: 1 Cans of beer, 1 Glasses of wine per week    Comment: Occasional  . Drug use: No  . Sexual activity: Yes    Partners: Female  Other Topics Concern  . Not on file  Social History Narrative  . Not on file   Social Determinants of Health   Financial Resource Strain:   . Difficulty of Paying Living Expenses:   Food Insecurity:   . Worried About Charity fundraiser in the Last Year:   . Arboriculturist in the Last Year:   Transportation Needs:   . Film/video editor (Medical):   Marland Kitchen Lack of Transportation (Non-Medical):   Physical Activity:   . Days of Exercise per Week:   . Minutes of Exercise per Session:   Stress:   . Feeling of Stress :   Social Connections:   . Frequency of Communication with Friends and Family:   . Frequency of Social Gatherings with Friends and Family:   . Attends Religious Services:   . Active Member of Clubs or Organizations:   . Attends Archivist Meetings:   Marland Kitchen Marital Status:     Family History  Problem Relation Age of Onset  .  Pancreatic cancer Mother   . Hypertension Mother   . Hypertension Father   . Skin cancer Sister     Health Maintenance  Topic Date Due  . Hepatitis C Screening  Never done  . COVID-19 Vaccine (1) Never done  . PNA vac Low Risk Adult (1 of 2 - PCV13) Never done  . Fecal DNA (Cologuard)  10/04/2018  . INFLUENZA VACCINE  01/16/2020  . TETANUS/TDAP  09/07/2025     ----------------------------------------------------------------------------------------------------------------------------------------------------------------------------------------------------------------- Physical Exam BP 140/82 (BP Location: Left Arm, Patient Position: Sitting, Cuff Size: Normal)   Pulse 65   Temp (!) 97.5 F (36.4 C)    Ht 5' 8.5" (1.74 m)   Wt 176 lb 9.6 oz (80.1 kg)   SpO2 97%   BMI 26.46 kg/m   Physical Exam Constitutional:      General: He is not in acute distress. HENT:     Head: Normocephalic and atraumatic.     Right Ear: External ear normal.     Left Ear: External ear normal.  Eyes:     General: No scleral icterus. Neck:     Thyroid: No thyromegaly.  Cardiovascular:     Rate and Rhythm: Normal rate and regular rhythm.     Heart sounds: Normal heart sounds.  Pulmonary:     Effort: Pulmonary effort is normal.     Breath sounds: Normal breath sounds.  Abdominal:     General: Bowel sounds are normal. There is no distension.     Palpations: Abdomen is soft.     Tenderness: There is no abdominal tenderness. There is no guarding.  Musculoskeletal:     Cervical back: Normal range of motion.  Lymphadenopathy:     Cervical: No cervical adenopathy.  Skin:    General: Skin is warm and dry.     Findings: No rash.  Neurological:     Mental Status: He is alert and oriented to person, place, and time.     Cranial Nerves: No cranial nerve deficit.     Motor: No abnormal muscle tone.     Comments: Resting tremor noted. Gait is slow, somewhat shuffling.  No cog-wheeling noted.   Psychiatric:        Behavior: Behavior normal.     ------------------------------------------------------------------------------------------------------------------------------------------------------------------------------------------------------------------- Assessment and Plan  Resting tremor HE has  Parkinsonian features including increased tremor, shuffling gait and some mild depressive symptoms.  Referral placed to neurology for further evaluation and treatment.   Well adult exam Well adult Orders Placed This Encounter  Procedures  . Cologuard  . COMPLETE METABOLIC PANEL WITH GFR  . CBC  . Lipid Profile  . TSH  . Ambulatory referral to Neurology    Referral Priority:   Routine    Referral Type:    Consultation    Referral Reason:   Specialty Services Required    Requested Specialty:   Neurology    Number of Visits Requested:   1  Screening: Lipid, cologuard Immunizations: Prevnar-13 Anticipatory guidance:  Recommendations per AVS   No orders of the defined types were placed in this encounter.   No follow-ups on file.    This visit occurred during the SARS-CoV-2 public health emergency.  Safety protocols were in place, including screening questions prior to the visit, additional usage of staff PPE, and extensive cleaning of exam room while observing appropriate contact time as indicated for disinfecting solutions.

## 2019-12-14 NOTE — Assessment & Plan Note (Signed)
HE has  Parkinsonian features including increased tremor, shuffling gait and some mild depressive symptoms.  Referral placed to neurology for further evaluation and treatment.

## 2019-12-15 LAB — COMPLETE METABOLIC PANEL WITH GFR
AG Ratio: 2 (calc) (ref 1.0–2.5)
ALT: 18 U/L (ref 9–46)
AST: 16 U/L (ref 10–35)
Albumin: 4.5 g/dL (ref 3.6–5.1)
Alkaline phosphatase (APISO): 63 U/L (ref 35–144)
BUN: 15 mg/dL (ref 7–25)
CO2: 30 mmol/L (ref 20–32)
Calcium: 9.4 mg/dL (ref 8.6–10.3)
Chloride: 102 mmol/L (ref 98–110)
Creat: 0.81 mg/dL (ref 0.70–1.25)
GFR, Est African American: 105 mL/min/{1.73_m2} (ref >=60)
GFR, Est Non African American: 91 mL/min/{1.73_m2} (ref >=60)
Globulin: 2.2 g/dL (calc) (ref 1.9–3.7)
Glucose, Bld: 104 mg/dL (ref 65–139)
Potassium: 4.6 mmol/L (ref 3.5–5.3)
Sodium: 138 mmol/L (ref 135–146)
Total Bilirubin: 0.6 mg/dL (ref 0.2–1.2)
Total Protein: 6.7 g/dL (ref 6.1–8.1)

## 2019-12-15 LAB — CBC
HCT: 45.5 % (ref 38.5–50.0)
Hemoglobin: 15.3 g/dL (ref 13.2–17.1)
MCH: 29.4 pg (ref 27.0–33.0)
MCHC: 33.6 g/dL (ref 32.0–36.0)
MCV: 87.5 fL (ref 80.0–100.0)
MPV: 9.8 fL (ref 7.5–12.5)
Platelets: 234 10*3/uL (ref 140–400)
RBC: 5.2 10*6/uL (ref 4.20–5.80)
RDW: 12.7 % (ref 11.0–15.0)
WBC: 8.1 10*3/uL (ref 3.8–10.8)

## 2019-12-15 LAB — LIPID PANEL
Cholesterol: 178 mg/dL (ref <200)
HDL: 34 mg/dL — ABNORMAL LOW (ref >=40)
LDL Cholesterol (Calc): 119 mg/dL (calc) — ABNORMAL HIGH
Non-HDL Cholesterol (Calc): 144 mg/dL (calc) — ABNORMAL HIGH (ref <130)
Total CHOL/HDL Ratio: 5.2 (calc) — ABNORMAL HIGH (ref <5.0)
Triglycerides: 130 mg/dL (ref <150)

## 2019-12-15 LAB — TSH: TSH: 2.07 mIU/L (ref 0.40–4.50)

## 2019-12-22 ENCOUNTER — Encounter: Payer: Self-pay | Admitting: Family Medicine

## 2019-12-22 ENCOUNTER — Other Ambulatory Visit: Payer: Self-pay | Admitting: Family Medicine

## 2019-12-22 DIAGNOSIS — R350 Frequency of micturition: Secondary | ICD-10-CM

## 2019-12-24 DIAGNOSIS — R350 Frequency of micturition: Secondary | ICD-10-CM | POA: Diagnosis not present

## 2019-12-24 LAB — PSA: PSA: 5.4 ng/mL — ABNORMAL HIGH (ref <=4.0)

## 2019-12-27 DIAGNOSIS — Z1211 Encounter for screening for malignant neoplasm of colon: Secondary | ICD-10-CM | POA: Diagnosis not present

## 2019-12-27 DIAGNOSIS — Z1212 Encounter for screening for malignant neoplasm of rectum: Secondary | ICD-10-CM | POA: Diagnosis not present

## 2019-12-29 ENCOUNTER — Other Ambulatory Visit: Payer: Self-pay | Admitting: Family Medicine

## 2019-12-29 DIAGNOSIS — R972 Elevated prostate specific antigen [PSA]: Secondary | ICD-10-CM

## 2019-12-31 LAB — COLOGUARD
COLOGUARD: NEGATIVE
Cologuard: NEGATIVE

## 2019-12-31 LAB — EXTERNAL GENERIC LAB PROCEDURE: COLOGUARD: NEGATIVE

## 2020-01-11 ENCOUNTER — Encounter: Payer: Self-pay | Admitting: Family Medicine

## 2020-01-12 DIAGNOSIS — G4752 REM sleep behavior disorder: Secondary | ICD-10-CM | POA: Diagnosis not present

## 2020-01-12 DIAGNOSIS — G20 Parkinson's disease: Secondary | ICD-10-CM | POA: Diagnosis not present

## 2020-01-25 DIAGNOSIS — G20 Parkinson's disease: Secondary | ICD-10-CM | POA: Diagnosis not present

## 2020-03-09 DIAGNOSIS — R69 Illness, unspecified: Secondary | ICD-10-CM | POA: Diagnosis not present

## 2020-04-11 DIAGNOSIS — R69 Illness, unspecified: Secondary | ICD-10-CM | POA: Diagnosis not present

## 2020-04-17 DIAGNOSIS — G20 Parkinson's disease: Secondary | ICD-10-CM | POA: Diagnosis not present

## 2020-09-21 DIAGNOSIS — R350 Frequency of micturition: Secondary | ICD-10-CM | POA: Diagnosis not present

## 2020-09-21 DIAGNOSIS — Z125 Encounter for screening for malignant neoplasm of prostate: Secondary | ICD-10-CM | POA: Diagnosis not present

## 2020-09-21 DIAGNOSIS — R351 Nocturia: Secondary | ICD-10-CM | POA: Diagnosis not present

## 2020-09-21 DIAGNOSIS — N401 Enlarged prostate with lower urinary tract symptoms: Secondary | ICD-10-CM | POA: Diagnosis not present

## 2020-09-21 DIAGNOSIS — R972 Elevated prostate specific antigen [PSA]: Secondary | ICD-10-CM | POA: Diagnosis not present

## 2020-10-16 DIAGNOSIS — G20 Parkinson's disease: Secondary | ICD-10-CM | POA: Diagnosis not present

## 2020-11-09 DIAGNOSIS — R972 Elevated prostate specific antigen [PSA]: Secondary | ICD-10-CM | POA: Diagnosis not present

## 2020-11-09 DIAGNOSIS — C61 Malignant neoplasm of prostate: Secondary | ICD-10-CM | POA: Diagnosis not present

## 2020-11-21 DIAGNOSIS — C61 Malignant neoplasm of prostate: Secondary | ICD-10-CM | POA: Diagnosis not present

## 2020-12-01 NOTE — Progress Notes (Addendum)
GU Location of Tumor / Histology:  Adenocarcinoma   If Prostate Cancer, Gleason Score is (3 + 3) and PSA is (6.2 as of 10/06/20), and Prostate size (66.59g)  Tanor Glaspy presented in 12/2019 with signs/symptoms of: elevated PSA and mild voiding complaints (frequency and nocturia 1-2x night), Tried tamsulosin, but experienced undesired side effects (such as random erections)  Biopsies revealed:  11/09/2020   Past/Anticipated interventions by urology, if any:  11/09/2020  Dr. Link Snuffer Transrectal ultrasound of the prostate with biopsy   Past/Anticipated interventions by medical oncology, if any:  No referral placed at this time  Weight changes, if any: Patient denies  IPSS Score: 10 (moderate) SHIM Score: 14 (moderate)  Bowel/Bladder complaints, if any: Denies any new symptoms, but does report he occasionally struggles with constipation (manages with Benefiber and prunes).  Reports occasional urinary frequency and urgency, and often does not feel like he can completely empty his bladder. Denies having to push or strain to start his stream, but does report a moderate stream with intermittency and occasional dribbling/leakage. Nocturia ranges from 1-4x night  Nausea/Vomiting, if any: Patient denies  Pain issues, if any:  Reports occasional left hip pain, but states it does not impact his mobility  SAFETY ISSUES: Prior radiation? No Pacemaker/ICD? No Possible current pregnancy? N/A Is the patient on methotrexate? No  Current Complaints / other details: Patient has received the first 2 Moderna vaccines. Recently diagnosed with Parkinson's and changed from carbidopa-levodopa to pramipexole dihydrochloride.

## 2020-12-03 NOTE — Progress Notes (Signed)
Radiation Oncology         [phone] ________________________________  Initial Outpatient Consultation - Conducted via MyChart face-to-face video visit due to current COVID-19 concerns for limiting patient exposure  Name: Natanael Saladin MRN: [id number]  Date: 12/04/2020  DOB: 1950/10/02  [id number], Einar Pheasant, DO  Gloriann Loan Desiree Hane, MD   REFERRING PHYSICIAN: Lucas Mallow, MD  DIAGNOSIS: 70 y.o. gentleman with Stage T1c adenocarcinoma of the prostate with Gleason score of 3+3, and PSA of 6.2.    ICD-10-CM   1. Malignant neoplasm of prostate ([place])  C61       HISTORY OF PRESENT ILLNESS: Ahmeer Tuman is a 70 y.o. male with a diagnosis of prostate cancer. He was noted to have an elevated PSA of 5.4 by his primary care physician, Dr. Zigmund Daniel, in 12/2019. Prior PSA in 2017 was also elevated at 4.85. Following this, he was diagnosed with Parkinson's disease, thus delaying his urologic care. He was referred for evaluation in urology by Dr. Gloriann Loan on 09/21/20,  digital rectal examination was performed at that time revealing no nodules. Repeat PSA from that day showed further elevation to 6.2. The patient proceeded to transrectal ultrasound with 12 biopsies of the prostate on 11/09/20.  The prostate volume measured 66.59 cc.  Out of 12 core biopsies, 8 were positive.  The maximum Gleason score was 3+3, and this was seen in left mid lateral (small focus), left base, left mid, left apex (small focus), right base, right apex (small focus), right base lateral, and right mid lateral (small focus).  The patient reviewed the biopsy results with his urologist and he has kindly been referred today for discussion of potential radiation treatment options.   PREVIOUS RADIATION THERAPY: No  PAST MEDICAL HISTORY:  Past Medical History:  Diagnosis Date   Anxiety    Depression    Varicose veins of legs       PAST SURGICAL HISTORY:No past surgical history on file.  FAMILY HISTORY:  Family History  Problem  Relation Age of Onset   Pancreatic cancer Mother    Hypertension Mother    Diabetes Mother    Hypertension Father    Skin cancer Sister    Hypertension Brother     SOCIAL HISTORY:  Social History   Socioeconomic History   Marital status: Married    Spouse name: Not on file   Number of children: Not on file   Years of education: Not on file   Highest education level: Not on file  Occupational History   Occupation: Retired  Tobacco Use   Smoking status: Never   Smokeless tobacco: Never  Vaping Use   Vaping Use: Never used  Substance and Sexual Activity   Alcohol use: Yes    Alcohol/week: 2.0 standard drinks    Types: 1 Glasses of wine, 1 Cans of beer per week    Comment: Occasional   Drug use: No   Sexual activity: Yes    Partners: Female  Other Topics Concern   Not on file  Social History Narrative   Not on file   Social Determinants of Health   Financial Resource Strain: Not on file  Food Insecurity: Not on file  Transportation Needs: Not on file  Physical Activity: Not on file  Stress: Not on file  Social Connections: Not on file  Intimate Partner Violence: Not on file    ALLERGIES: Patient has no known allergies.  MEDICATIONS:  Current Outpatient Medications  Medication Sig Dispense Refill  Multiple Vitamins-Minerals (MULTIVITAMIN PO) Take by mouth.     Omega-3 Fatty Acids (FISH OIL OMEGA-3 PO) Take by mouth.     Probiotic Product (PROBIOTIC PO) Take by mouth.     No current facility-administered medications for this encounter.    REVIEW OF SYSTEMS:  On review of systems, the patient reports that he is doing well overall. He denies any chest pain, shortness of breath, cough, fevers, chills, night sweats, unintended weight changes. He denies any bowel disturbances, and denies abdominal pain, nausea or vomiting. He denies any new musculoskeletal or joint aches or pains. His IPSS was 10, indicating moderate urinary symptoms. His SHIM was 14, indicating he  does have moderate erectile dysfunction. A complete review of systems is obtained and is otherwise negative.    PHYSICAL EXAM:  Wt Readings from Last 3 Encounters:  12/14/19 176 lb 9.6 oz (80.1 kg)  06/06/19 172 lb (78 kg)  04/24/16 176 lb (79.8 kg)   Temp Readings from Last 3 Encounters:  12/14/19 (!) 97.5 F (36.4 C)  06/06/19 98 F (36.7 C) (Oral)   BP Readings from Last 3 Encounters:  12/14/19 140/82  06/06/19 138/79  04/24/16 123/75   Pulse Readings from Last 3 Encounters:  12/14/19 65  06/06/19 62  04/24/16 82    /10  In general this is a well appearing man in no acute distress. He's alert and oriented x4 and appropriate throughout the examination. Cardiopulmonary assessment is negative for acute distress and he exhibits normal effort   KPS = 100  100 - Normal; no complaints; no evidence of disease. 90   - Able to carry on normal activity; minor signs or symptoms of disease. 80   - Normal activity with effort; some signs or symptoms of disease. 57   - Cares for self; unable to carry on normal activity or to do active work. 60   - Requires occasional assistance, but is able to care for most of his personal needs. 50   - Requires considerable assistance and frequent medical care. 59   - Disabled; requires special care and assistance. 59   - Severely disabled; hospital admission is indicated although death not imminent. 46   - Very sick; hospital admission necessary; active supportive treatment necessary. 10   - Moribund; fatal processes progressing rapidly. 0     - Dead  Karnofsky DA, Abelmann [place], Craver LS and Burchenal [facility] [phone]) The use of the nitrogen mustards in the palliative treatment of carcinoma: with particular reference to bronchogenic carcinoma Cancer 1 634-56  LABORATORY DATA:  Lab Results  Component Value Date   WBC 8.1 12/14/2019   HGB 15.3 12/14/2019   HCT 45.5 12/14/2019   MCV 87.5 12/14/2019   PLT 234 12/14/2019   Lab Results  Component  Value Date   NA 138 12/14/2019   K 4.6 12/14/2019   CL 102 12/14/2019   CO2 30 12/14/2019   Lab Results  Component Value Date   ALT 18 12/14/2019   AST 16 12/14/2019   ALKPHOS 75 04/24/2016   BILITOT 0.6 12/14/2019     RADIOGRAPHY: No results found.    IMPRESSION/PLAN: This visit was conducted via MyChart to spare the patient unnecessary potential exposure in the healthcare setting during the current COVID-19 pandemic.  1. 70 y.o. gentleman with Stage T1c adenocarcinoma of the prostate with Gleason Score of 3+3, and PSA of 6.2. We discussed the patient's workup and outlined the nature of prostate cancer in this setting. The patient's  T stage, Gleason's score, and PSA put him into the favorable low risk group. Accordingly, he is eligible for a variety of potential treatment options including brachytherapy, 5.5 weeks of external radiation, prostatectomy or preferred active surveillance. We discussed the available radiation techniques, and focused on the details and logistics and delivery. The patient may not be an ideal candidate for brachytherapy with a prostate volume of 67 cc prior to downsizing from hormone therapy. We discussed and outlined the risks, benefits, short and long-term effects associated with radiotherapy and compared and contrasted these with prostatectomy. We discussed the role of SpaceOAR in reducing the rectal toxicity associated with radiotherapy. He appears to have a good understanding of his disease and our treatment recommendations which are of curative intent.  He was encouraged to ask questions that were answered to his stated satisfaction.  At the end of the conversation the patient is interested in moving forward with active surveillance (AS).  Given current concerns for patient exposure during the COVID-19 pandemic, this encounter was conducted via video-enabled MyChart visit. The patient has given verbal consent for this type of encounter. The time spent during  this encounter was 60 minutes. The attendants for this meeting include Tyler Pita MD and patient Skippy Marhefka and his wife. During the encounter, Tyler Pita MD was located at [facility] Radiation Oncology Department.  Patient Flemon Kelty and wife were located at home.   ------------------------------------------------   Tyler Pita, MD [place] Direct Dial: [phone]  Fax: [phone] [email or web address]  Skype  [email or web address]   This document serves as a record of services personally performed by Tyler Pita, MD. It was created on his behalf by Wilburn Mylar, a trained medical scribe. The creation of this record is based on the scribe's personal observations and the provider's statements to them. This document has been checked and approved by the attending provider.

## 2020-12-04 ENCOUNTER — Ambulatory Visit
Admission: RE | Admit: 2020-12-04 | Discharge: 2020-12-04 | Disposition: A | Discharge status: Home / Self Care | Payer: Medicare HMO | Source: Ambulatory Visit | Attending: Radiation Oncology | Admitting: Radiation Oncology

## 2020-12-04 DIAGNOSIS — C61 Malignant neoplasm of prostate: Secondary | ICD-10-CM | POA: Insufficient documentation

## 2021-01-16 ENCOUNTER — Ambulatory Visit: Payer: Medicare HMO | Admitting: Family Medicine

## 2021-02-13 DIAGNOSIS — G20 Parkinson's disease: Secondary | ICD-10-CM | POA: Diagnosis not present

## 2021-02-28 ENCOUNTER — Encounter: Payer: Self-pay | Admitting: Family Medicine

## 2021-02-28 ENCOUNTER — Ambulatory Visit (INDEPENDENT_AMBULATORY_CARE_PROVIDER_SITE_OTHER): Payer: Medicare HMO | Admitting: Family Medicine

## 2021-02-28 ENCOUNTER — Other Ambulatory Visit: Payer: Self-pay | Admitting: Urology

## 2021-02-28 DIAGNOSIS — G20 Parkinson's disease: Secondary | ICD-10-CM

## 2021-02-28 DIAGNOSIS — C61 Malignant neoplasm of prostate: Secondary | ICD-10-CM

## 2021-03-04 NOTE — Assessment & Plan Note (Signed)
Stable at this time, managed by urology.

## 2021-03-04 NOTE — Assessment & Plan Note (Signed)
Stable at this time with current dose of Mirapex.  He will continue to see neurology for management.

## 2021-03-04 NOTE — Progress Notes (Signed)
Marc Reynolds - 70 y.o. male MRN [id number]  Date of birth: March 02, 1951  Subjective No chief complaint on file.   HPI Marc Reynolds is a 70 year old male here today for follow-up visit.  He has history of parkinsonism and prostate cancer.  He did have recent visit with his neurologist and was changed from Sinemet to pramipexole.  This has been titrated up since starting this.  He is tolerating this well and feels like it has helped some.  Gait and tremor have improved some.  He has a history of prostate cancer as well which has currently managed through urology.  No treatment at this time, but is having active surveillance.  ROS:  A comprehensive ROS was completed and negative except as noted per HPI  Past Medical History:  Diagnosis Date   Anxiety    Depression    Varicose veins of legs     History reviewed. No pertinent surgical history.  Social History   Socioeconomic History   Marital status: Married    Spouse name: Not on file   Number of children: Not on file   Years of education: Not on file   Highest education level: Not on file  Occupational History   Occupation: Retired  Tobacco Use   Smoking status: Never   Smokeless tobacco: Never  Vaping Use   Vaping Use: Never used  Substance and Sexual Activity   Alcohol use: Yes    Alcohol/week: 2.0 standard drinks    Types: 1 Glasses of wine, 1 Cans of beer per week    Comment: Occasional   Drug use: No   Sexual activity: Yes    Partners: Female  Other Topics Concern   Not on file  Social History Narrative   Not on file   Social Determinants of Health   Financial Resource Strain: Not on file  Food Insecurity: Not on file  Transportation Needs: Not on file  Physical Activity: Not on file  Stress: Not on file  Social Connections: Not on file    Family History  Problem Relation Age of Onset   Pancreatic cancer Mother    Hypertension Mother    Diabetes Mother    Hypertension Father    Skin cancer Sister     Hypertension Brother     Health Maintenance  Topic Date Due   Hepatitis C Screening  Never done   Zoster Vaccines- Shingrix (1 of 2) Never done   COVID-19 Vaccine (3 - Moderna risk series) 03/09/2020   INFLUENZA VACCINE  Never done   Fecal DNA (Cologuard)  12/27/2022   TETANUS/TDAP  09/07/2025   HPV VACCINES  Aged Out     ----------------------------------------------------------------------------------------------------------------------------------------------------------------------------------------------------------------- Physical Exam BP 126/67   Pulse 64   Temp 98.5 F (36.9 C)   Ht '5\' 8"'$  (1.727 m)   Wt 169 lb 8 oz (76.9 kg)   SpO2 100%   BMI 25.77 kg/m   Physical Exam Constitutional:      Appearance: Normal appearance.  HENT:     Head: Normocephalic and atraumatic.  Eyes:     General: No scleral icterus. Cardiovascular:     Rate and Rhythm: Normal rate and regular rhythm.  Pulmonary:     Effort: Pulmonary effort is normal.     Breath sounds: Normal breath sounds.  Musculoskeletal:     Cervical back: Neck supple.  Neurological:     General: No focal deficit present.     Mental Status: He is alert.  Psychiatric:  Mood and Affect: Mood normal.        Behavior: Behavior normal.    ------------------------------------------------------------------------------------------------------------------------------------------------------------------------------------------------------------------- Assessment and Plan  Parkinsonism ([place]) Stable at this time with current dose of Mirapex.  He will continue to see neurology for management.  Malignant neoplasm of prostate ([place]) Stable at this time, managed by urology.   No orders of the defined types were placed in this encounter.   No follow-ups on file.    This visit occurred during the SARS-CoV-2 public health emergency.  Safety protocols were in place, including screening questions prior to the  visit, additional usage of staff PPE, and extensive cleaning of exam room while observing appropriate contact time as indicated for disinfecting solutions.

## 2021-03-17 ENCOUNTER — Ambulatory Visit
Admission: RE | Admit: 2021-03-17 | Discharge: 2021-03-17 | Disposition: A | Discharge status: Home / Self Care | Payer: Medicare HMO | Source: Ambulatory Visit | Attending: Urology | Admitting: Urology

## 2021-03-17 ENCOUNTER — Other Ambulatory Visit: Payer: Self-pay

## 2021-03-17 DIAGNOSIS — C61 Malignant neoplasm of prostate: Secondary | ICD-10-CM

## 2021-03-17 MED ORDER — GADOBENATE DIMEGLUMINE 529 MG/ML IV SOLN
15.0000 mL | Freq: Once | INTRAVENOUS | Status: AC | PRN
  Administered 2021-03-17: 15 mL via INTRAVENOUS

## 2021-05-16 DIAGNOSIS — G20 Parkinson's disease: Secondary | ICD-10-CM | POA: Diagnosis not present

## 2021-08-16 DIAGNOSIS — G20 Parkinson's disease: Secondary | ICD-10-CM | POA: Diagnosis not present

## 2021-08-29 ENCOUNTER — Ambulatory Visit (INDEPENDENT_AMBULATORY_CARE_PROVIDER_SITE_OTHER): Payer: Medicare HMO | Admitting: Family Medicine

## 2021-08-29 DIAGNOSIS — Z0000 Encounter for general adult medical examination without abnormal findings: Secondary | ICD-10-CM | POA: Diagnosis not present

## 2021-08-29 NOTE — Progress Notes (Signed)
? ? ?MEDICARE ANNUAL WELLNESS VISIT ? ?08/29/2021 ? ?Telephone Visit Disclaimer ?This Medicare AWV was conducted by telephone due to national recommendations for restrictions regarding the COVID-19 Pandemic (e.g. social distancing).  I verified, using two identifiers, that I am speaking with Marc Reynolds or their authorized healthcare agent. I discussed the limitations, risks, security, and privacy concerns of performing an evaluation and management service by telephone and the potential availability of an in-person appointment in the future. The patient expressed understanding and agreed to proceed.  ?Location of Patient: Home ?Location of Provider (nurse):  In the office. ? ?Subjective:  ? ? ?Marc Reynolds is a 71 y.o. male patient of Luetta Nutting, DO who had a Medicare Annual Wellness Visit today via telephone. Glenda is Retired and lives with their family. he has 3 children. he reports that he is socially active and does interact with friends/family regularly. he is moderately physically active and enjoys being outdoors.  ? ?Patient Care Team: ?Luetta Nutting, DO as PCP - General (Family Medicine) ? ?Advanced Directives 12/04/2020 12/14/2019  ?Does Patient Have a Medical Advance Directive? Yes No  ?Type of Paramedic of [place];Living will -  ?Does patient want to make changes to medical advance directive? No - Patient declined -  ?Copy of [place] in Chart? No - copy requested -  ?Would patient like information on creating a medical advance directive? - Yes (ED - Information included in AVS)  ? ? ?Hospital Utilization Over the Past 12 Months: ?# of hospitalizations or ER visits: 0 ?# of surgeries: 0 ? ?Review of Systems    ?Patient reports that his overall health is unchanged compared to last year. ? ?History obtained from chart review and the patient ? ?Patient Reported Readings (BP, Pulse, CBG, Weight, etc) ?none ? ?Pain Assessment ?Pain : 0-10 ?Pain Score: 2  ?Pain Type:  Chronic pain ?Pain Location: Hip ?Pain Descriptors / Indicators: Constant ?Pain Onset: More than a month ago ?Pain Frequency: Constant ?Pain Relieving Factors: [place] ? ?Pain Relieving Factors: Iburpofen ? ?Current Medications & Allergies (verified) ?Allergies as of 08/29/2021   ?No Known Allergies ?  ? ?  ?Medication List  ?  ? ?  ? Accurate as of August 29, 2021  9:24 AM. If you have any questions, ask your nurse or doctor.  ?  ?  ? ?  ? ?FISH OIL OMEGA-3 PO ?Take by mouth. ?  ?MULTIVITAMIN PO ?Take by mouth. ?  ?pramipexole 0.125 MG tablet ?Commonly known as: MIRAPEX ?Take 0.375 mg by mouth 3 (three) times daily. Take 3 tablets by mouth 3 times daily. ?  ?pramipexole 1 MG tablet ?Commonly known as: MIRAPEX ?Take 1 mg by mouth 3 (three) times daily. ?  ?PROBIOTIC PO ?Take by mouth. ?  ?UNABLE TO FIND ?  ? ?  ? ? ?History (reviewed): ?Past Medical History:  ?Diagnosis Date  ? Anxiety   ? Depression   ? Varicose veins of legs   ? ?History reviewed. No pertinent surgical history. ?Family History  ?Problem Relation Age of Onset  ? Pancreatic cancer Mother   ? Hypertension Mother   ? Diabetes Mother   ? Cancer Mother   ? Hypertension Father   ? Alcohol abuse Father   ? Arthritis Father   ? Varicose Veins Father   ? Skin cancer Sister   ? Hypertension Brother   ? Cancer Maternal Grandmother   ? ADD / ADHD Son   ? ADD / ADHD Son   ?  Learning disabilities Son   ? Birth defects Daughter   ? Intellectual disability Daughter   ? Learning disabilities Daughter   ? Obesity Daughter   ? Hypertension Brother   ? ?Social History  ? ?Socioeconomic History  ? Marital status: Married  ?  Spouse name: June  ? Number of children: 3  ? Years of education: 90  ? Highest education level: Master's degree (e.g., MA, MS, MEng, MEd, MSW, MBA)  ?Occupational History  ? Occupation: Retired  ?Tobacco Use  ? Smoking status: Never  ? Smokeless tobacco: Never  ?Vaping Use  ? Vaping Use: Never used  ?Substance and Sexual Activity  ? Alcohol use:  Yes  ?  Alcohol/week: 2.0 standard drinks  ?  Types: 1 Glasses of wine, 1 Cans of beer per week  ?  Comment: Drink occasionally.  ? Drug use: Never  ? Sexual activity: Not Currently  ?  Partners: Female  ?  Birth control/protection: None  ?Other Topics Concern  ? Not on file  ?Social History Narrative  ? Lives with his wife and daughter (she has down syndrome). He enjoys being outdoors.   ? ?Social Determinants of Health  ? ?Financial Resource Strain: Low Risk   ? Difficulty of Paying Living Expenses: Not hard at all  ?Food Insecurity: No Food Insecurity  ? Worried About Charity fundraiser in the Last Year: Never true  ? Ran Out of Food in the Last Year: Never true  ?Transportation Needs: No Transportation Needs  ? Lack of Transportation (Medical): No  ? Lack of Transportation (Non-Medical): No  ?Physical Activity: Sufficiently Active  ? Days of Exercise per Week: 5 days  ? Minutes of Exercise per Session: 50 min  ?Stress: No Stress Concern Present  ? Feeling of Stress : Not at all  ?Social Connections: Socially Integrated  ? Frequency of Communication with Friends and Family: More than three times a week  ? Frequency of Social Gatherings with Friends and Family: More than three times a week  ? Attends Religious Services: More than 4 times per year  ? Active Member of Clubs or Organizations: Yes  ? Attends Archivist Meetings: More than 4 times per year  ? Marital Status: Married  ? ? ?Activities of Daily Living ?In your present state of health, do you have any difficulty performing the following activities: 08/27/2021  ?Hearing? N  ?Vision? N  ?Difficulty concentrating or making decisions? N  ?Walking or climbing stairs? N  ?Dressing or bathing? N  ?Doing errands, shopping? N  ?Preparing Food and eating ? N  ?Using the Toilet? N  ?In the past six months, have you accidently leaked urine? Y  ?Do you have problems with loss of bowel control? N  ?Managing your Medications? N  ?Managing your Finances? N   ?Housekeeping or managing your Housekeeping? N  ?Some recent data might be hidden  ? ? ?Patient Education/ Literacy ?How often do you need to have someone help you when you read instructions, pamphlets, or other written materials from your doctor or pharmacy?: 1 - Never ?What is the last grade level you completed in school?: Master's degree ? ?Exercise ?Current Exercise Habits: Home exercise routine;Structured exercise class, Type of exercise: treadmill;Other - see comments (spin class), Time (Minutes): 50, Frequency (Times/Week): 5, Weekly Exercise (Minutes/Week): 250, Intensity: Moderate, Exercise limited by: None identified ? ?Diet ?Patient reports consuming  2-3  meals a day and 2 snack(s) a day ?Patient reports that his primary diet  is: Regular ?Patient reports that she does have regular access to food.  ? ?Depression Screen ?PHQ 2/9 Scores 08/29/2021 02/28/2021 12/14/2019 04/24/2016 09/08/2015  ?PHQ - 2 Score 0 2 2 0 0  ?PHQ- 9 Score 4 - 8 - -  ?  ? ?Fall Risk ?Fall Risk  08/29/2021 08/27/2021 02/28/2021 04/24/2016  ?Falls in the past year? '1 1 1 '$ No  ?Number falls in past yr: 0 0 1 -  ?Injury with Fall? 0 0 0 -  ?Risk for fall due to : History of fall(s) - Impaired balance/gait -  ?Follow up Falls evaluation completed - Falls prevention discussed;Falls evaluation completed -  ? ?  ?Objective:  ?Dasean Brow seemed alert and oriented and he participated appropriately during our telephone visit. ? ?Blood Pressure Weight BMI  ?BP Readings from Last 3 Encounters:  ?02/28/21 126/67  ?12/14/19 140/82  ?06/06/19 138/79  ? Wt Readings from Last 3 Encounters:  ?02/28/21 169 lb 8 oz (76.9 kg)  ?12/14/19 176 lb 9.6 oz (80.1 kg)  ?06/06/19 172 lb (78 kg)  ? BMI Readings from Last 1 Encounters:  ?02/28/21 25.77 kg/m?  ?  ?*Unable to obtain current vital signs, weight, and BMI due to telephone visit type ? ?Hearing/Vision  ?Amen did not seem to have difficulty with hearing/understanding during the telephone conversation ?Reports that  he has had a formal eye exam by an eye care professional within the past year ?Reports that he has not had a formal hearing evaluation within the past year ?*Unable to fully assess hearing and vision duri

## 2021-08-29 NOTE — Patient Instructions (Addendum)
?MEDICARE ANNUAL WELLNESS VISIT ?Health Maintenance Summary and Written Plan of Care ? ?Marc Reynolds , ? ?Thank you for allowing me to perform your Medicare Annual Wellness Visit and for your ongoing commitment to your health.  ? ?Health Maintenance & Immunization History ?Health Maintenance  ?Topic Date Due  ?? INFLUENZA VACCINE  09/14/2021 (Originally 01/15/2021)  ?? COVID-19 Vaccine (3 - Moderna risk series) 09/14/2021 (Originally 03/09/2020)  ?? Zoster Vaccines- Shingrix (1 of 2) 11/29/2021 (Originally [birth year]/1969)  ?? Pneumonia Vaccine 18+ Years old (2 - PPSV23 if available, else PCV20) 08/30/2022 (Originally 12/13/2020)  ?? Hepatitis C Screening  08/30/2022 (Originally 08/31/1968)  ?? Fecal DNA (Cologuard)  12/27/2022  ?? TETANUS/TDAP  09/07/2025  ?? HPV VACCINES  Aged Out  ? ?Immunization History  ?Administered Date(s) Administered  ?? Moderna Sars-Covid-2 Vaccination 01/13/2020, 02/10/2020  ?? Pneumococcal Conjugate-13 12/14/2019  ?? Tdap 09/08/2015  ? ? ?These are the patient goals that we discussed: ? Goals Addressed   ?  ?  ?  ?  ?  ? This Visit's Progress  ? ?  Patient Stated (pt-stated)     ?   08/29/2021 ?AWV Goal: Improved Nutrition/Diet ? ?Patient will verbalize understanding that diet plays an important role in overall health and that a poor diet is a risk factor for many chronic medical conditions.  ?Over the next year, patient will improve self management of their diet by incorporating more water. ?Patient will utilize available community resources to help with food acquisition if needed (ex: food pantries, [address], etc) ?Patient will work with nutrition specialist if a referral was made  ?  ?  ?  ? ?This is a list of Health Maintenance Items that are overdue or due now: ?Pneumococcal vaccine  ?Influenza vaccine ?Shingrix vaccine ? ?Orders/Referrals Placed Today: ?No orders of the defined types were placed in this encounter. ? ?(Contact our referral department at [phone] if you have not spoken with  someone about your referral appointment within the next 5 days)  ? ? ?Follow-up Plan ?Follow-up with Luetta Nutting, DO as planned ?Shingrix vaccine can be done at the pharmacy. ?Pneumonia and flu vaccine can be done at the office. ?Medicare wellness visit in one year.  ?AVS printed and mailed to the patient. ? ? ? ?  ?Health Maintenance, Male ?Adopting a healthy lifestyle and getting preventive care are important in promoting health and wellness. Ask your health care provider about: ?The right schedule for you to have regular tests and exams. ?Things you can do on your own to prevent diseases and keep yourself healthy. ?What should I know about diet, weight, and exercise? ?Eat a healthy diet ? ?Eat a diet that includes plenty of vegetables, fruits, low-fat dairy products, and lean protein. ?Do not eat a lot of foods that are high in solid fats, added sugars, or sodium. ?Maintain a healthy weight ?Body mass index (BMI) is a measurement that can be used to identify possible weight problems. It estimates body fat based on height and weight. Your health care provider can help determine your BMI and help you achieve or maintain a healthy weight. ?Get regular exercise ?Get regular exercise. This is one of the most important things you can do for your health. Most adults should: ?Exercise for at least 150 minutes each week. The exercise should increase your heart rate and make you sweat (moderate-intensity exercise). ?Do strengthening exercises at least twice a week. This is in addition to the moderate-intensity exercise. ?Spend less time sitting. Even light physical  activity can be beneficial. ?Watch cholesterol and blood lipids ?Have your blood tested for lipids and cholesterol at 71 years of age, then have this test every 5 years. ?You may need to have your cholesterol levels checked more often if: ?Your lipid or cholesterol levels are high. ?You are older than 71 years of age. ?You are at high risk for heart  disease. ?What should I know about cancer screening? ?Many types of cancers can be detected early and may often be prevented. Depending on your health history and family history, you may need to have cancer screening at various ages. This may include screening for: ?Colorectal cancer. ?Prostate cancer. ?Skin cancer. ?Lung cancer. ?What should I know about heart disease, diabetes, and high blood pressure? ?Blood pressure and heart disease ?High blood pressure causes heart disease and increases the risk of stroke. This is more likely to develop in people who have high blood pressure readings or are overweight. ?Talk with your health care provider about your target blood pressure readings. ?Have your blood pressure checked: ?Every 3-5 years if you are 8-12 years of age. ?Every year if you are 16 years old or older. ?If you are between the ages of 65 and 30 and are a current or former smoker, ask your health care provider if you should have a one-time screening for abdominal aortic aneurysm (AAA). ?Diabetes ?Have regular diabetes screenings. This checks your fasting blood sugar level. Have the screening done: ?Once every three years after age 86 if you are at a normal weight and have a low risk for diabetes. ?More often and at a younger age if you are overweight or have a high risk for diabetes. ?What should I know about preventing infection? ?Hepatitis B ?If you have a higher risk for hepatitis B, you should be screened for this virus. Talk with your health care provider to find out if you are at risk for hepatitis B infection. ?Hepatitis C ?Blood testing is recommended for: ?Everyone born from [birth year] through 1965. ?Anyone with known risk factors for hepatitis C. ?Sexually transmitted infections (STIs) ?You should be screened each year for STIs, including gonorrhea and chlamydia, if: ?You are sexually active and are younger than 71 years of age. ?You are older than 71 years of age and your health care provider tells you  that you are at risk for this type of infection. ?Your sexual activity has changed since you were last screened, and you are at increased risk for chlamydia or gonorrhea. Ask your health care provider if you are at risk. ?Ask your health care provider about whether you are at high risk for HIV. Your health care provider may recommend a prescription medicine to help prevent HIV infection. If you choose to take medicine to prevent HIV, you should first get tested for HIV. You should then be tested every 3 months for as long as you are taking the medicine. ?Follow these instructions at home: ?Alcohol use ?Do not drink alcohol if your health care provider tells you not to drink. ?If you drink alcohol: ?Limit how much you have to 0-2 drinks a day. ?Know how much alcohol is in your drink. In the [place] one drink equals one 12 oz bottle of beer (355 mL), one 5 oz glass of wine (148 mL), or one 1? oz glass of hard liquor (44 mL). ?Lifestyle ?Do not use any products that contain nicotine or tobacco. These products include cigarettes, chewing tobacco, and vaping devices, such as e-cigarettes. If you need help  quitting, ask your health care provider. ?Do not use street drugs. ?Do not share needles. ?Ask your health care provider for help if you need support or information about quitting drugs. ?General instructions ?Schedule regular health, dental, and eye exams. ?Stay current with your vaccines. ?Tell your health care provider if: ?You often feel depressed. ?You have ever been abused or do not feel safe at home. ?Summary ?Adopting a healthy lifestyle and getting preventive care are important in promoting health and wellness. ?Follow your health care provider's instructions about healthy diet, exercising, and getting tested or screened for diseases. ?Follow your health care provider's instructions on monitoring your cholesterol and blood pressure. ?This information is not intended to replace advice given to you by your health  care provider. Make sure you discuss any questions you have with your health care provider. ?Document Revised: 10/23/2020 Document Reviewed: 10/23/2020 ?Elsevier Patient Education ? [place]. ? ?

## 2021-09-27 DIAGNOSIS — C61 Malignant neoplasm of prostate: Secondary | ICD-10-CM | POA: Diagnosis not present

## 2021-10-04 DIAGNOSIS — C61 Malignant neoplasm of prostate: Secondary | ICD-10-CM | POA: Diagnosis not present

## 2021-10-04 DIAGNOSIS — N400 Enlarged prostate without lower urinary tract symptoms: Secondary | ICD-10-CM | POA: Diagnosis not present

## 2021-11-14 DIAGNOSIS — H25813 Combined forms of age-related cataract, bilateral: Secondary | ICD-10-CM | POA: Diagnosis not present

## 2021-11-14 DIAGNOSIS — H524 Presbyopia: Secondary | ICD-10-CM | POA: Diagnosis not present

## 2022-02-20 DIAGNOSIS — G20 Parkinson's disease: Secondary | ICD-10-CM | POA: Diagnosis not present

## 2022-02-20 DIAGNOSIS — G629 Polyneuropathy, unspecified: Secondary | ICD-10-CM | POA: Diagnosis not present

## 2022-03-06 DIAGNOSIS — M25661 Stiffness of right knee, not elsewhere classified: Secondary | ICD-10-CM | POA: Diagnosis not present

## 2022-03-06 DIAGNOSIS — G20 Parkinson's disease: Secondary | ICD-10-CM | POA: Diagnosis not present

## 2022-03-06 DIAGNOSIS — M25562 Pain in left knee: Secondary | ICD-10-CM | POA: Diagnosis not present

## 2022-03-06 DIAGNOSIS — M25662 Stiffness of left knee, not elsewhere classified: Secondary | ICD-10-CM | POA: Diagnosis not present

## 2022-03-20 DIAGNOSIS — G20A1 Parkinson's disease without dyskinesia, without mention of fluctuations: Secondary | ICD-10-CM | POA: Diagnosis not present

## 2022-03-20 DIAGNOSIS — M25562 Pain in left knee: Secondary | ICD-10-CM | POA: Diagnosis not present

## 2022-03-20 DIAGNOSIS — M25661 Stiffness of right knee, not elsewhere classified: Secondary | ICD-10-CM | POA: Diagnosis not present

## 2022-03-20 DIAGNOSIS — M25662 Stiffness of left knee, not elsewhere classified: Secondary | ICD-10-CM | POA: Diagnosis not present

## 2022-03-27 DIAGNOSIS — G20A1 Parkinson's disease without dyskinesia, without mention of fluctuations: Secondary | ICD-10-CM | POA: Diagnosis not present

## 2022-03-27 DIAGNOSIS — M25562 Pain in left knee: Secondary | ICD-10-CM | POA: Diagnosis not present

## 2022-03-27 DIAGNOSIS — M25661 Stiffness of right knee, not elsewhere classified: Secondary | ICD-10-CM | POA: Diagnosis not present

## 2022-03-27 DIAGNOSIS — M25662 Stiffness of left knee, not elsewhere classified: Secondary | ICD-10-CM | POA: Diagnosis not present

## 2022-03-28 DIAGNOSIS — C61 Malignant neoplasm of prostate: Secondary | ICD-10-CM | POA: Diagnosis not present

## 2022-04-02 DIAGNOSIS — M25562 Pain in left knee: Secondary | ICD-10-CM | POA: Diagnosis not present

## 2022-04-02 DIAGNOSIS — M25661 Stiffness of right knee, not elsewhere classified: Secondary | ICD-10-CM | POA: Diagnosis not present

## 2022-04-02 DIAGNOSIS — M25662 Stiffness of left knee, not elsewhere classified: Secondary | ICD-10-CM | POA: Diagnosis not present

## 2022-04-02 DIAGNOSIS — G20A1 Parkinson's disease without dyskinesia, without mention of fluctuations: Secondary | ICD-10-CM | POA: Diagnosis not present

## 2022-04-04 DIAGNOSIS — C61 Malignant neoplasm of prostate: Secondary | ICD-10-CM | POA: Diagnosis not present

## 2022-04-04 DIAGNOSIS — R972 Elevated prostate specific antigen [PSA]: Secondary | ICD-10-CM | POA: Diagnosis not present

## 2022-04-04 DIAGNOSIS — N401 Enlarged prostate with lower urinary tract symptoms: Secondary | ICD-10-CM | POA: Diagnosis not present

## 2022-04-04 DIAGNOSIS — R3911 Hesitancy of micturition: Secondary | ICD-10-CM | POA: Diagnosis not present

## 2022-04-08 DIAGNOSIS — G20A1 Parkinson's disease without dyskinesia, without mention of fluctuations: Secondary | ICD-10-CM | POA: Diagnosis not present

## 2022-04-08 DIAGNOSIS — M25661 Stiffness of right knee, not elsewhere classified: Secondary | ICD-10-CM | POA: Diagnosis not present

## 2022-04-08 DIAGNOSIS — M25662 Stiffness of left knee, not elsewhere classified: Secondary | ICD-10-CM | POA: Diagnosis not present

## 2022-04-08 DIAGNOSIS — M25562 Pain in left knee: Secondary | ICD-10-CM | POA: Diagnosis not present

## 2022-07-16 DIAGNOSIS — L821 Other seborrheic keratosis: Secondary | ICD-10-CM | POA: Diagnosis not present

## 2022-07-16 DIAGNOSIS — L814 Other melanin hyperpigmentation: Secondary | ICD-10-CM | POA: Diagnosis not present

## 2022-07-16 DIAGNOSIS — D485 Neoplasm of uncertain behavior of skin: Secondary | ICD-10-CM | POA: Diagnosis not present

## 2022-07-16 DIAGNOSIS — D225 Melanocytic nevi of trunk: Secondary | ICD-10-CM | POA: Diagnosis not present

## 2022-07-16 DIAGNOSIS — L570 Actinic keratosis: Secondary | ICD-10-CM | POA: Diagnosis not present

## 2022-07-26 DIAGNOSIS — R69 Illness, unspecified: Secondary | ICD-10-CM | POA: Diagnosis not present

## 2022-07-29 DIAGNOSIS — D0359 Melanoma in situ of other part of trunk: Secondary | ICD-10-CM | POA: Diagnosis not present

## 2022-08-25 ENCOUNTER — Encounter: Payer: Self-pay | Admitting: Family Medicine

## 2022-08-30 ENCOUNTER — Ambulatory Visit (INDEPENDENT_AMBULATORY_CARE_PROVIDER_SITE_OTHER): Payer: Medicare HMO

## 2022-08-30 ENCOUNTER — Ambulatory Visit (INDEPENDENT_AMBULATORY_CARE_PROVIDER_SITE_OTHER): Payer: Medicare HMO | Admitting: Sports Medicine

## 2022-08-30 DIAGNOSIS — M25561 Pain in right knee: Secondary | ICD-10-CM | POA: Diagnosis not present

## 2022-08-30 DIAGNOSIS — M1712 Unilateral primary osteoarthritis, left knee: Secondary | ICD-10-CM | POA: Diagnosis not present

## 2022-08-30 DIAGNOSIS — M25562 Pain in left knee: Secondary | ICD-10-CM | POA: Diagnosis not present

## 2022-08-30 DIAGNOSIS — M1711 Unilateral primary osteoarthritis, right knee: Secondary | ICD-10-CM | POA: Diagnosis not present

## 2022-08-30 MED ORDER — MELOXICAM 15 MG PO TABS: ORAL_TABLET | ORAL | 3 refills | Status: DC

## 2022-08-30 NOTE — Assessment & Plan Note (Signed)
Pleasant 72 year old male, long history of knee soreness, medial joint line, gelling. No mechanical symptoms. Mild effusion, tenderness medial joint line, positive McMurray's sign. 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 Adding meloxicam, x-rays, home physical therapy. Return to see me in 4 weeks.

## 2022-08-30 NOTE — Progress Notes (Signed)
    Procedures performed today:    None.  Independent interpretation of notes and tests performed by another provider:   None.  Brief History, Exam, Impression, and Recommendations:    Left medial knee pain Pleasant 72 year old male, long history of knee soreness, medial joint line, gelling. No mechanical symptoms. Mild effusion, tenderness medial joint line, positive McMurray's sign. 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 Adding meloxicam, x-rays, home physical therapy. Return to see me in 4 weeks.    ____________________________________________ Gwen Her. Dianah Field, M.D., ABFM., CAQSM., AME. Primary Care and Sports Medicine [facility]  Adjunct Professor of [place] [employer]  Risk manager

## 2022-09-02 ENCOUNTER — Ambulatory Visit (INDEPENDENT_AMBULATORY_CARE_PROVIDER_SITE_OTHER): Payer: Medicare HMO | Admitting: Family Medicine

## 2022-09-02 DIAGNOSIS — Z0000 Encounter for general adult medical examination without abnormal findings: Secondary | ICD-10-CM | POA: Diagnosis not present

## 2022-09-02 NOTE — Patient Instructions (Addendum)
[place] Maintenance Summary and Written Plan of Care  [name] ,  Thank you for allowing me to perform your Medicare Annual Wellness Visit and for your ongoing commitment to your health.   Health Maintenance & Immunization History Health Maintenance  Topic Date Due   INFLUENZA VACCINE  09/15/2022 (Originally 01/15/2022)   COVID-19 Vaccine (3 - Moderna risk series) 09/18/2022 (Originally 03/09/2020)   Zoster Vaccines- Shingrix (1 of 2) 12/03/2022 (Originally 08[birth year]31/1969)   Pneumonia Vaccine 22+ Years old (2 of 2 - PPSV23 or PCV20) 09/02/2023 (Originally 12/13/2020)   Hepatitis C Screening  09/02/2023 (Originally 08/31/1968)   Fecal DNA (Cologuard)  12/27/2022   Medicare Annual Wellness (AWV)  09/02/2023   DTaP/Tdap/Td (2 - Td or Tdap) 09/07/2025   HPV VACCINES  Aged Out   Immunization History  Administered Date(s) Administered   [employer] Vaccination 01/13/2020, 02/10/2020   Pneumococcal Conjugate-13 12/14/2019   Tdap 09/08/2015    These are the patient goals that we discussed:  Goals Addressed               This Visit's Progress     Patient Stated (pt-stated)        Patient stated that he would like to loose 15 lbs.         This is a list of Health Maintenance Items that are overdue or due now: Pneumococcal vaccine  Influenza vaccine Shingles vaccine    Orders/Referrals Placed Today: No orders of the defined types were placed in this encounter.  (Contact our referral department at [phone] if you have not spoken with someone about your referral appointment within the next 5 days)    Follow-up Plan Follow-up with Luetta Nutting, DO as planned Schedule shingles vaccine at the pharmacy.  Pneumonia vaccine can be done at the pharmacy or in office. Cologuard due in July.  Medicare wellness visit in one year.  Patient will access AVS on my chart.     Health Maintenance, Male Adopting a healthy lifestyle and getting  preventive care are important in promoting health and wellness. Ask your health care provider about: The right schedule for you to have regular tests and exams. Things you can do on your own to prevent diseases and keep yourself healthy. What should I know about diet, weight, and exercise? Eat a healthy diet  Eat a diet that includes plenty of vegetables, fruits, low-fat dairy products, and lean protein. Do not eat a lot of foods that are high in solid fats, added sugars, or sodium. Maintain a healthy weight Body mass index (BMI) is a measurement that can be used to identify possible weight problems. It estimates body fat based on height and weight. Your health care provider can help determine your BMI and help you achieve or maintain a healthy weight. Get regular exercise Get regular exercise. This is one of the most important things you can do for your health. Most adults should: Exercise for at least 150 minutes each week. The exercise should increase your heart rate and make you sweat (moderate-intensity exercise). Do strengthening exercises at least twice a week. This is in addition to the moderate-intensity exercise. Spend less time sitting. Even light physical activity can be beneficial. Watch cholesterol and blood lipids Have your blood tested for lipids and cholesterol at 72 years of age, then have this test every 5 years. You may need to have your cholesterol levels checked more often if: Your lipid or cholesterol levels are high. You are older  than 72 years of age. You are at high risk for heart disease. What should I know about cancer screening? Many types of cancers can be detected early and may often be prevented. Depending on your health history and family history, you may need to have cancer screening at various ages. This may include screening for: Colorectal cancer. Prostate cancer. Skin cancer. Lung cancer. What should I know about heart disease, diabetes, and high blood  pressure? Blood pressure and heart disease High blood pressure causes heart disease and increases the risk of stroke. This is more likely to develop in people who have high blood pressure readings or are overweight. Talk with your health care provider about your target blood pressure readings. Have your blood pressure checked: Every 3-5 years if you are 18-40 years of age. Every year if you are 13 years old or older. If you are between the ages of 14 and 59 and are a current or former smoker, ask your health care provider if you should have a one-time screening for abdominal aortic aneurysm (AAA). Diabetes Have regular diabetes screenings. This checks your fasting blood sugar level. Have the screening done: Once every three years after age 73 if you are at a normal weight and have a low risk for diabetes. More often and at a younger age if you are overweight or have a high risk for diabetes. What should I know about preventing infection? Hepatitis B If you have a higher risk for hepatitis B, you should be screened for this virus. Talk with your health care provider to find out if you are at risk for hepatitis B infection. Hepatitis C Blood testing is recommended for: Everyone born from [birth year] through 1965. Anyone with known risk factors for hepatitis C. Sexually transmitted infections (STIs) You should be screened each year for STIs, including gonorrhea and chlamydia, if: You are sexually active and are younger than 72 years of age. You are older than 72 years of age and your health care provider tells you that you are at risk for this type of infection. Your sexual activity has changed since you were last screened, and you are at increased risk for chlamydia or gonorrhea. Ask your health care provider if you are at risk. Ask your health care provider about whether you are at high risk for HIV. Your health care provider may recommend a prescription medicine to help prevent HIV infection. If you  choose to take medicine to prevent HIV, you should first get tested for HIV. You should then be tested every 3 months for as long as you are taking the medicine. Follow these instructions at home: Alcohol use Do not drink alcohol if your health care provider tells you not to drink. If you drink alcohol: Limit how much you have to 0-2 drinks a day. Know how much alcohol is in your drink. In the [place] one drink equals one 12 oz bottle of beer (355 mL), one 5 oz glass of wine (148 mL), or one 1 oz glass of hard liquor (44 mL). Lifestyle Do not use any products that contain nicotine or tobacco. These products include cigarettes, chewing tobacco, and vaping devices, such as e-cigarettes. If you need help quitting, ask your health care provider. Do not use street drugs. Do not share needles. Ask your health care provider for help if you need support or information about quitting drugs. General instructions Schedule regular health, dental, and eye exams. Stay current with your vaccines. Tell your health care provider if:  You often feel depressed. You have ever been abused or do not feel safe at home. Summary Adopting a healthy lifestyle and getting preventive care are important in promoting health and wellness. Follow your health care provider's instructions about healthy diet, exercising, and getting tested or screened for diseases. Follow your health care provider's instructions on monitoring your cholesterol and blood pressure. This information is not intended to replace advice given to you by your health care provider. Make sure you discuss any questions you have with your health care provider. Document Revised: 10/23/2020 Document Reviewed: 10/23/2020 Elsevier Patient Education  [place].

## 2022-09-02 NOTE — Progress Notes (Signed)
MEDICARE ANNUAL WELLNESS VISIT  09/02/2022  Telephone Visit Disclaimer This Medicare AWV was conducted by telephone due to national recommendations for restrictions regarding the COVID-19 Pandemic (e.g. social distancing).  I verified, using two identifiers, that I am speaking with Marc Reynolds or their authorized healthcare agent. I discussed the limitations, risks, security, and privacy concerns of performing an evaluation and management service by telephone and the potential availability of an in-person appointment in the future. The patient expressed understanding and agreed to proceed.  Location of Patient: Home Location of Provider (nurse):  In the office.  Subjective:    Marc Reynolds is a 72 y.o. male patient of Marc Nutting, Marc Reynolds who had a Medicare Annual Wellness Visit today via telephone. Marc Reynolds is Retired and lives with their family. he has 3 children. he reports that he is socially active and does interact with friends/family regularly. he is moderately physically active and enjoys being outdoors.  Patient Care Team: Marc Nutting, Marc Reynolds as PCP - General (Family Medicine)     09/02/2022    9:54 AM 12/04/2020    9:51 AM 12/14/2019   10:15 AM  Advanced Directives  Does Patient Have a Medical Advance Directive? Yes Yes No  Type of Advance Directive Living will;Healthcare Power of [place];Living will   Does patient want to make changes to medical advance directive? No - Patient declined No - Patient declined   Copy of [place] in Chart? No - copy requested No - copy requested   Would patient like information on creating a medical advance directive?   Yes (ED - Information included in AVS)    Hospital Utilization Over the Past 12 Months: # of hospitalizations or ER visits: 0 # of surgeries: 0  Review of Systems    Patient reports that his overall health is unchanged compared to last year.  History obtained from chart review and  the patient  Patient Reported Readings (BP, Pulse, CBG, Weight, etc) none  Pain Assessment Pain : 0-10 Pain Score: 3  Pain Type: Acute pain Pain Location: Knee Pain Orientation: Left Pain Descriptors / Indicators: Dull Pain Onset: 1 to 4 weeks ago Pain Frequency: Constant Pain Relieving Factors: medication  Pain Relieving Factors: medication  Current Medications & Allergies (verified) Allergies as of 09/02/2022   No Known Allergies      Medication List        Accurate as of September 02, 2022 10:09 AM. If you have any questions, ask your nurse or doctor.          FISH OIL OMEGA-3 PO Take by mouth.   meloxicam 15 MG tablet Commonly known as: MOBIC One tab PO every 24 hours with a meal for 2 weeks, then once every 24 hours prn pain.   MULTIVITAMIN PO Take by mouth.   pramipexole 0.125 MG tablet Commonly known as: MIRAPEX Take 0.375 mg by mouth 3 (three) times daily. Take 3 tablets by mouth 3 times daily.   pramipexole 1 MG tablet Commonly known as: MIRAPEX Take 1 mg by mouth 3 (three) times daily.   PROBIOTIC PO Take by mouth.   UNABLE TO FIND        History (reviewed): Past Medical History:  Diagnosis Date   Anxiety    Cancer [facility])    Prostate 2022   Depression    Neuromuscular disorder ([place])    Parkinson's 2021   Varicose veins of legs    History reviewed. No pertinent surgical  history. Family History  Problem Relation Age of Onset   Pancreatic cancer Mother    Hypertension Mother    Diabetes Mother    Cancer Mother    Hypertension Father    Alcohol abuse Father    Arthritis Father    Varicose Veins Father    Skin cancer Sister    Hypertension Brother    Cancer Maternal Grandmother    ADD / ADHD Son    ADD / ADHD Son    Learning disabilities Son    Birth defects Daughter    Intellectual disability Daughter    Learning disabilities Daughter    Obesity Daughter    Hypertension Brother    Social History   Socioeconomic History    Marital status: Married    Spouse name: June   Number of children: 3   Years of education: 16   Highest education level: Conservator, museum/gallery (e.g., MA, MS, MEng, MEd, MSW, MBA)  Occupational History   Occupation: Retired  Tobacco Use   Smoking status: Never   Smokeless tobacco: Never  Vaping Use   Vaping Use: Never used  Substance and Sexual Activity   Alcohol use: Yes    Alcohol/week: 2.0 standard drinks of alcohol    Types: 1 Glasses of wine, 1 Cans of beer per week    Comment: Drink occasionally.   Drug use: Never   Sexual activity: Not Currently    Partners: Female    Birth control/protection: None  Other Topics Concern   Not on file  Social History Narrative   Lives with his wife and daughter (she has down syndrome). He enjoys being outdoors.    Social Determinants of Health   Reynolds Resource Strain: Low Risk  (08/26/2022)   Overall Reynolds Resource Strain (CARDIA)    Difficulty of Paying Living Expenses: Not hard at all  Food Insecurity: No Food Insecurity (08/26/2022)   Hunger Vital Sign    Worried About Running Out of Food in the Last Year: Never true    Ran Out of Food in the Last Year: Never true  Transportation Needs: No Transportation Needs (08/26/2022)   PRAPARE - Hydrologist (Medical): No    Lack of Transportation (Non-Medical): No  Physical Activity: Sufficiently Active (08/26/2022)   Exercise Vital Sign    Days of Exercise per Week: 6 days    Minutes of Exercise per Session: 30 min  Stress: Stress Concern Present (08/26/2022)   [place]    Feeling of Stress : To some extent  Social Connections: Socially Integrated (09/02/2022)   Social Connection and Isolation Panel [NHANES]    Frequency of Communication with Friends and Family: More than three times a week    Frequency of Social Gatherings with Friends and Family: Twice a week    Attends Religious Services:  More than 4 times per year    Active Member of [employer] or Organizations: Yes    Attends Archivist Meetings: More than 4 times per year    Marital Status: Married    Activities of Daily Living    08/26/2022    2:02 PM  In your present state of health, Marc Reynolds you have any difficulty performing the following activities:  Hearing? 0  Vision? 0  Difficulty concentrating or making decisions? 0  Walking or climbing stairs? 0  Dressing or bathing? 0  Doing errands, shopping? 0  Preparing Food and eating ? N  Using  the Toilet? N  In the past six months, have you accidently leaked urine? Y  Marc Reynolds you have problems with loss of bowel control? N  Managing your Medications? N  Managing your Finances? N  Housekeeping or managing your Housekeeping? N    Patient Education/ Literacy How often Marc Reynolds you need to have someone help you when you read instructions, pamphlets, or other written materials from your doctor or pharmacy?: 1 - Never What is the last grade level you completed in school?: Bachelor's degree  Exercise Current Exercise Habits: Home exercise routine, Type of exercise: walking;strength training/weights, Time (Minutes): 30, Frequency (Times/Week): 6, Weekly Exercise (Minutes/Week): 180, Intensity: Moderate, Exercise limited by: orthopedic condition(s)  Diet Patient reports consuming 3 meals a day and 1-2 snack(s) a day Patient reports that his primary diet is: Regular Patient reports that she does have regular access to food.   Depression Screen    09/02/2022    9:52 AM 08/29/2021    9:06 AM 02/28/2021    4:01 PM 12/14/2019   10:14 AM 04/24/2016    1:06 PM 09/08/2015    9:13 AM  PHQ 2/9 Scores  PHQ - 2 Score 0 0 2 2 0 0  PHQ- 9 Score  4  8       Fall Risk    09/02/2022    9:52 AM 08/26/2022    2:02 PM 08/29/2021    9:05 AM 08/27/2021    5:30 PM 02/28/2021    4:01 PM  Fall Risk   Falls in the past year? 0 0 1 1 1   Number falls in past yr: 0  0 0 1  Injury with Fall? 0  0  0 0  Risk for fall due to : Impaired balance/gait  History of fall(s)  Impaired balance/gait  Follow up Falls evaluation completed  Falls evaluation completed  Falls prevention discussed;Falls evaluation completed     Objective:  Marc Reynolds seemed alert and oriented and he participated appropriately during our telephone visit.  Blood Pressure Weight BMI  BP Readings from Last 3 Encounters:  02/28/21 126/67  12/14/19 140/82  06/06/19 138/79   Wt Readings from Last 3 Encounters:  02/28/21 169 lb 8 oz (76.9 kg)  12/14/19 176 lb 9.6 oz (80.1 kg)  06/06/19 172 lb (78 kg)   BMI Readings from Last 1 Encounters:  02/28/21 25.77 kg/m    *Unable to obtain current vital signs, weight, and BMI due to telephone visit type  Hearing/Vision  Marc Reynolds did not seem to have difficulty with hearing/understanding during the telephone conversation Reports that he has had a formal eye exam by an eye care professional within the past year Reports that he has not had a formal hearing evaluation within the past year *Unable to fully assess hearing and vision during telephone visit type  Cognitive Function:    09/02/2022    9:56 AM 08/29/2021    9:12 AM  6CIT Screen  What Year? 0 points 0 points  What month? 0 points 0 points  What time? 0 points 0 points  Count back from 20 0 points 0 points  Months in reverse 0 points 2 points  Repeat phrase 0 points 0 points  Total Score 0 points 2 points   (Normal:0-7, Significant for Dysfunction: >8)  Normal Cognitive Function Screening: Yes   Immunization & Health Maintenance Record Immunization History  Administered Date(s) Administered   Moderna Sars-Covid-2 Vaccination 01/13/2020, 02/10/2020   Pneumococcal Conjugate-13 12/14/2019   Tdap 09/08/2015  Health Maintenance  Topic Date Due   INFLUENZA VACCINE  09/15/2022 (Originally 01/15/2022)   COVID-19 Vaccine (3 - Moderna risk series) 09/18/2022 (Originally 03/09/2020)   Zoster Vaccines- Shingrix (1  of 2) 12/03/2022 (Originally [birth year]31/1969)   Pneumonia Vaccine 72+ Years old (2 of 2 - PPSV23 or PCV20) 09/02/2023 (Originally 12/13/2020)   Hepatitis C Screening  09/02/2023 (Originally 08/31/1968)   Fecal DNA (Cologuard)  12/27/2022   Medicare Annual Wellness (AWV)  09/02/2023   DTaP/Tdap/Td (2 - Td or Tdap) 09/07/2025   HPV VACCINES  Aged Out       Assessment  This is a routine wellness examination for Marc Reynolds.  Health Maintenance: Due or Overdue There are no preventive care reminders to display for this patient.   Marc Reynolds does not need a referral for Community Assistance: Care Management:   no Social Work:    no Prescription Assistance:  no Nutrition/Diabetes Education:  no   Plan:  Personalized Goals  Goals Addressed               This Visit's Progress     Patient Stated (pt-stated)        Patient stated that he would like to loose 15 lbs.       Personalized Health Maintenance & Screening Recommendations  Pneumococcal vaccine  Influenza vaccine Shingles vaccine  Lung Cancer Screening Recommended: no (Low Dose CT Chest recommended if Age 23-80 years, 30 pack-year currently smoking OR have quit w/in past 15 years) Hepatitis C Screening recommended: yes HIV Screening recommended: no  Advanced Directives: Written information was not prepared per patient's request.  Referrals & Orders No orders of the defined types were placed in this encounter.   Follow-up Plan Follow-up with Marc Nutting, Marc Reynolds as planned Schedule shingles vaccine at the pharmacy.  Pneumonia vaccine can be done at the pharmacy or in office. Cologuard due in July.  Medicare wellness visit in one year.  Patient will access AVS on my chart.   I have personally reviewed and noted the following in the patient's chart:   Medical and social history Use of alcohol, tobacco or illicit drugs  Current medications and supplements Functional ability and status Nutritional status Physical  activity Advanced directives List of other physicians Hospitalizations, surgeries, and ER visits in previous 12 months Vitals Screenings to include cognitive, depression, and falls Referrals and appointments  In addition, I have reviewed and discussed with Marc Reynolds certain preventive protocols, quality metrics, and best practice recommendations. A written personalized care plan for preventive services as well as general preventive health recommendations is available and can be mailed to the patient at his request.      Tinnie Gens, RN BSN  09/02/2022

## 2022-09-27 DIAGNOSIS — C61 Malignant neoplasm of prostate: Secondary | ICD-10-CM | POA: Diagnosis not present

## 2022-09-30 ENCOUNTER — Ambulatory Visit (INDEPENDENT_AMBULATORY_CARE_PROVIDER_SITE_OTHER): Payer: Medicare HMO | Admitting: Sports Medicine

## 2022-09-30 DIAGNOSIS — M1712 Unilateral primary osteoarthritis, left knee: Secondary | ICD-10-CM

## 2022-09-30 NOTE — Progress Notes (Signed)
    Procedures performed today:    None.  Independent interpretation of notes and tests performed by another provider:   None.  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 He did well with meloxicam and home conditioning, still has a bit of discomfort but tolerable, return as needed.    ____________________________________________ Ihor Austin. Benjamin Stain, M.D., ABFM., CAQSM., AME. Primary Care and Sports Medicine [facility]  Adjunct Professor of Family Medicine  [place] [employer]  Restaurant manager, fast food

## 2022-09-30 NOTE — Assessment & Plan Note (Signed)
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 He did well with meloxicam and home conditioning, still has a bit of discomfort but tolerable, return as needed.

## 2022-10-04 DIAGNOSIS — N401 Enlarged prostate with lower urinary tract symptoms: Secondary | ICD-10-CM | POA: Diagnosis not present

## 2022-10-04 DIAGNOSIS — C61 Malignant neoplasm of prostate: Secondary | ICD-10-CM | POA: Diagnosis not present

## 2022-10-04 DIAGNOSIS — R351 Nocturia: Secondary | ICD-10-CM | POA: Diagnosis not present

## 2022-10-04 DIAGNOSIS — R3911 Hesitancy of micturition: Secondary | ICD-10-CM | POA: Diagnosis not present

## 2022-10-07 ENCOUNTER — Other Ambulatory Visit: Payer: Self-pay | Admitting: Urology

## 2022-10-07 DIAGNOSIS — C61 Malignant neoplasm of prostate: Secondary | ICD-10-CM

## 2022-10-09 ENCOUNTER — Encounter: Payer: Self-pay | Admitting: Family Medicine

## 2022-10-09 DIAGNOSIS — S01511A Laceration without foreign body of lip, initial encounter: Secondary | ICD-10-CM | POA: Diagnosis not present

## 2022-10-09 DIAGNOSIS — Z23 Encounter for immunization: Secondary | ICD-10-CM | POA: Diagnosis not present

## 2022-10-09 DIAGNOSIS — S0990XA Unspecified injury of head, initial encounter: Secondary | ICD-10-CM | POA: Diagnosis not present

## 2022-10-09 DIAGNOSIS — S0121XA Laceration without foreign body of nose, initial encounter: Secondary | ICD-10-CM | POA: Diagnosis not present

## 2022-10-09 DIAGNOSIS — W010XXA Fall on same level from slipping, tripping and stumbling without subsequent striking against object, initial encounter: Secondary | ICD-10-CM | POA: Diagnosis not present

## 2022-10-09 DIAGNOSIS — S6992XA Unspecified injury of left wrist, hand and finger(s), initial encounter: Secondary | ICD-10-CM | POA: Diagnosis not present

## 2022-10-09 DIAGNOSIS — W01198A Fall on same level from slipping, tripping and stumbling with subsequent striking against other object, initial encounter: Secondary | ICD-10-CM | POA: Diagnosis not present

## 2022-10-09 DIAGNOSIS — G20A1 Parkinson's disease without dyskinesia, without mention of fluctuations: Secondary | ICD-10-CM | POA: Diagnosis not present

## 2022-10-09 DIAGNOSIS — R296 Repeated falls: Secondary | ICD-10-CM | POA: Diagnosis not present

## 2022-10-09 DIAGNOSIS — Z79899 Other long term (current) drug therapy: Secondary | ICD-10-CM | POA: Diagnosis not present

## 2022-10-09 DIAGNOSIS — S022XXA Fracture of nasal bones, initial encounter for closed fracture: Secondary | ICD-10-CM | POA: Diagnosis not present

## 2022-10-09 DIAGNOSIS — M25532 Pain in left wrist: Secondary | ICD-10-CM | POA: Diagnosis not present

## 2022-10-09 DIAGNOSIS — S0181XA Laceration without foreign body of other part of head, initial encounter: Secondary | ICD-10-CM | POA: Diagnosis not present

## 2022-10-10 ENCOUNTER — Encounter: Payer: Medicare HMO | Admitting: Family Medicine

## 2022-10-15 ENCOUNTER — Ambulatory Visit (INDEPENDENT_AMBULATORY_CARE_PROVIDER_SITE_OTHER): Payer: Medicare HMO | Admitting: Family Medicine

## 2022-10-15 ENCOUNTER — Encounter: Payer: Self-pay | Admitting: Family Medicine

## 2022-10-15 VITALS — BP 167/93 | HR 59 | Ht 68.0 in | Wt 184.0 lb

## 2022-10-15 DIAGNOSIS — Z4802 Encounter for removal of sutures: Secondary | ICD-10-CM | POA: Diagnosis not present

## 2022-10-15 DIAGNOSIS — G252 Other specified forms of tremor: Secondary | ICD-10-CM

## 2022-10-15 DIAGNOSIS — G20C Parkinsonism, unspecified: Secondary | ICD-10-CM

## 2022-10-15 DIAGNOSIS — R2689 Other abnormalities of gait and mobility: Secondary | ICD-10-CM

## 2022-10-15 NOTE — Progress Notes (Signed)
Marc Reynolds - 72 y.o. male MRN [id number]  Date of birth: [birth year]  Subjective Chief Complaint  Patient presents with   Suture / Staple Removal    HPI Marc Reynolds is a 72 year old male here today for follow-up of recent fall.  He experienced a fall while walking.  He reports that his balance has not been very good recently.  He has not seen his neurologist recently.  In regards to his Parkinson's.  He does feel unsteady.  He is not currently using any assistive devices.  He was seen in the ED after his most recent fall.  He had lacerations to the face and lip.  He had 3 sutures placed in the left upper lip.  Other facial lacerations were repaired with Dermabond.  He is here today to have sutures removed.  No increased pain or drainage around the areas.  ROS:  A comprehensive ROS was completed and negative except as noted per HPI  No Known Allergies  Past Medical History:  Diagnosis Date   Anxiety    Cancer [facility])    Prostate 2022   Depression    Neuromuscular disorder (HCC)    Parkinson's 2021   Varicose veins of legs     History reviewed. No pertinent surgical history.  Social History   Socioeconomic History   Marital status: Married    Spouse name: June   Number of children: 3   Years of education: 16   Highest education level: Manufacturing engineer (e.g., MA, MS, MEng, MEd, MSW, MBA)  Occupational History   Occupation: Retired  Tobacco Use   Smoking status: Never   Smokeless tobacco: Never  Vaping Use   Vaping Use: Never used  Substance and Sexual Activity   Alcohol use: Yes    Alcohol/week: 2.0 standard drinks of alcohol    Types: 1 Glasses of wine, 1 Cans of beer per week    Comment: Drink occasionally.   Drug use: Never   Sexual activity: Not Currently    Partners: Female    Birth control/protection: None  Other Topics Concern   Not on file  Social History Narrative   Lives with his wife and daughter (she has down syndrome). He enjoys being outdoors.    Social  Determinants of Health   Financial Resource Strain: Low Risk  (10/12/2022)   Overall Financial Resource Strain (CARDIA)    Difficulty of Paying Living Expenses: Not hard at all  Food Insecurity: No Food Insecurity (10/12/2022)   Hunger Vital Sign    Worried About Running Out of Food in the Last Year: Never true    Ran Out of Food in the Last Year: Never true  Transportation Needs: No Transportation Needs (10/12/2022)   PRAPARE - Administrator, Civil Service (Medical): No    Lack of Transportation (Non-Medical): No  Physical Activity: Sufficiently Active (10/12/2022)   Exercise Vital Sign    Days of Exercise per Week: 5 days    Minutes of Exercise per Session: 30 min  Stress: Stress Concern Present (10/12/2022)   [employer] of Occupational Health - Occupational Stress Questionnaire    Feeling of Stress : To some extent  Social Connections: Socially Integrated (10/12/2022)   Social Connection and Isolation Panel [NHANES]    Frequency of Communication with Friends and Family: More than three times a week    Frequency of Social Gatherings with Friends and Family: Twice a week    Attends Religious Services: More than 4 times per year  Active Member of Clubs or Organizations: Yes    Attends Engineer, structural: More than 4 times per year    Marital Status: Married    Family History  Problem Relation Age of Onset   Pancreatic cancer Mother    Hypertension Mother    Diabetes Mother    Cancer Mother    Hypertension Father    Alcohol abuse Father    Arthritis Father    Varicose Veins Father    Skin cancer Sister    Hypertension Brother    Cancer Maternal Grandmother    ADD / ADHD Son    ADD / ADHD Son    Learning disabilities Son    Birth defects Daughter    Intellectual disability Daughter    Learning disabilities Daughter    Obesity Daughter    Hypertension Brother     Health Maintenance  Topic Date Due   Zoster Vaccines- Shingrix (1 of 2)  12/03/2022 (Originally 08/31/1969)   COVID-19 Vaccine (3 - Moderna risk series) 04/02/2023 (Originally 03/09/2020)   Pneumonia Vaccine 64+ Years old (2 of 2 - PPSV23 or PCV20) 09/02/2023 (Originally 12/13/2020)   Hepatitis C Screening  09/02/2023 (Originally [birth year]31/1968)   Fecal DNA (Cologuard)  12/27/2022   INFLUENZA VACCINE  01/16/2023   Medicare Annual Wellness (AWV)  09/02/2023   DTaP/Tdap/Td (3 - Td or Tdap) 10/08/2032   HPV VACCINES  Aged Out     ----------------------------------------------------------------------------------------------------------------------------------------------------------------------------------------------------------------- Physical Exam BP (!) 167/93 (BP Location: Right Arm, Patient Position: Sitting, Cuff Size: Normal)   Pulse (!) 59   Ht 5\' 8"  (1.727 m)   Wt 184 lb (83.5 kg)   SpO2 100%   BMI 27.98 kg/m   Physical Exam Constitutional:      Appearance: Normal appearance.  HENT:     Head:     Comments: Bruising and dried blood along the left side of the face.  Sutures in the left upper lip.  3 sutures removed without difficulty. Eyes:     General: No scleral icterus. Neurological:     Mental Status: He is alert.     Comments: Wide, unsteady gait.  Psychiatric:        Mood and Affect: Mood normal.        Behavior: Behavior normal.     ------------------------------------------------------------------------------------------------------------------------------------------------------------------------------------------------------------------- Assessment and Plan  Parkinsonism (HCC) He is having increased difficulty with balance.  Referral placed to PT.  He will also schedule follow-up with his neurologist.  Resting tremor 3 sutures removed without difficulty.  Postprocedure instructions given.   No orders of the defined types were placed in this encounter.   No follow-ups on file.    This visit occurred during the SARS-CoV-2 public  health emergency.  Safety protocols were in place, including screening questions prior to the visit, additional usage of staff PPE, and extensive cleaning of exam room while observing appropriate contact time as indicated for disinfecting solutions.

## 2022-10-15 NOTE — Assessment & Plan Note (Signed)
He is having increased difficulty with balance.  Referral placed to PT.  He will also schedule follow-up with his neurologist.

## 2022-10-15 NOTE — Assessment & Plan Note (Signed)
3 sutures removed without difficulty.  Postprocedure instructions given.

## 2022-10-18 ENCOUNTER — Encounter: Payer: Self-pay | Admitting: Rehabilitative and Restorative Service Providers"

## 2022-10-18 ENCOUNTER — Ambulatory Visit: Payer: Medicare HMO | Attending: Family Medicine | Admitting: Rehabilitative and Restorative Service Providers"

## 2022-10-18 ENCOUNTER — Encounter: Payer: Self-pay | Admitting: Urology

## 2022-10-18 ENCOUNTER — Other Ambulatory Visit: Payer: Self-pay

## 2022-10-18 DIAGNOSIS — R2681 Unsteadiness on feet: Secondary | ICD-10-CM | POA: Insufficient documentation

## 2022-10-18 DIAGNOSIS — M6281 Muscle weakness (generalized): Secondary | ICD-10-CM | POA: Insufficient documentation

## 2022-10-18 DIAGNOSIS — G20C Parkinsonism, unspecified: Secondary | ICD-10-CM | POA: Insufficient documentation

## 2022-10-18 DIAGNOSIS — R2689 Other abnormalities of gait and mobility: Secondary | ICD-10-CM | POA: Insufficient documentation

## 2022-10-18 NOTE — Therapy (Signed)
OUTPATIENT PHYSICAL THERAPY NEURO EVALUATION  Patient Name: Marc Reynolds MRN: [id number] DOB:1950/06/22, 72 y.o., male Today's Date: 10/18/2022  PCP: Everrett Coombe, DO REFERRING PROVIDER: Everrett Coombe, DO  END OF SESSION:  PT End of Session - 10/18/22 1024     Visit Number 1    Number of Visits 16    Date for PT Re-Evaluation 12/17/22    Authorization Type aetna medicare    PT Start Time 1022    PT Stop Time 1100    PT Time Calculation (min) 38 min    Activity Tolerance Patient tolerated treatment well    Behavior During Therapy [facility] for tasks assessed/performed             Past Medical History:  Diagnosis Date   Anxiety    Cancer (HCC)    Prostate 2022   Depression    Neuromuscular disorder (HCC)    Parkinson's 2021   Varicose veins of legs    History reviewed. No pertinent surgical history. Patient Active Problem List   Diagnosis Date Noted   Visit for suture removal 10/15/2022   Primary osteoarthritis of left knee 08/30/2022   Malignant neoplasm of prostate (HCC) 12/04/2020   Well adult exam 12/14/2019   Resting tremor 12/14/2019   Parkinsonism 05/01/2018   Hypertriglyceridemia 09/11/2015   Leukocytosis 09/11/2015   Anxiety and depression 08/11/2015   BPH (benign prostatic hyperplasia) 08/11/2015    ONSET DATE: 10/15/22 REFERRING DIAG: Primary Parkinsonism, balance problem THERAPY DIAG:  No diagnosis found.  Rationale for Evaluation and Treatment: Rehabilitation  SUBJECTIVE:                                                                                                                                                                                             SUBJECTIVE STATEMENT: The patient had a recent fall and injured his left hand. He reports he walks fast and starts moving quickly and can't stop. He has a mild tremor left hand.  Pt accompanied by: self PERTINENT HISTORY: dx Parkinson's 2021, anxiety, depression.  PAIN:  Are you having pain? Yes:  NPRS scale: 4/10 Pain location: L hand-- sprained during fall Pain description: sore Aggravating factors: movement Relieving factors: rest PRECAUTIONS: Fall WEIGHT BEARING RESTRICTIONS: No FALLS: Has patient fallen in last 6 months? Yes. Number of falls 1 ; has near falls. LIVING ENVIRONMENT: Lives with: lives with their spouse Lives in: House/apartment Stairs: Yes: External: 1 and 3, depending on door to enter steps; none Has following equipment at home: None PLOF: Independent PATIENT GOALS: Improve balance, return to independent walking.  OBJECTIVE:  COGNITION: Overall cognitive status:  note dec'd  multi-tasking abilities with cognitive TUG  SENSATION: WFL  COORDINATION: Note resting L hand tremor  POSTURE: rounded shoulders, forward head, and flexed trunk   LOWER EXTREMITY ROM:   WFLs-- note some tightness hamstrings and heel cords with flexed knees in standing  LOWER EXTREMITY MMT:  Able to lift against gravity during functional assessments.  BED MOBILITY:  TBA  TRANSFERS: Sit to stand: Complete Independence Stand to sit: Complete Independence Floor:  TBA  STAIRS: Comments: TBA  GAIT: Gait pattern: decreased arm swing- Right, decreased arm swing- Left, decreased step length- Right, decreased step length- Left, decreased stance time- Left, decreased stride length, decreased ankle dorsiflexion- Right, decreased ankle dorsiflexion- Left, knee flexed in stance- Right, knee flexed in stance- Left, and shuffling Distance walked: 200 ft Assistive device utilized: None Level of assistance: Complete Independence  FUNCTIONAL TESTS:  5 times sit to stand: 13.45 seconds Timed up and go (TUG): 13.85 seconds TUG cognitive: 17.01 seconds MiniBESTest:  15/32  (<20/32 is fall risk) Berg=43/56 51M: 2.18 ft/sec    [facility] Adult PT Treatment:                                                DATE: 10/18/22 Therapeutic Exercise: PWR! Up exercises in sitting position PWR! Up  sit<>stand PWR! In stand with mini squat to tall posture (not yet safe for home)   PATIENT EDUCATION: Education details: HEP Person educated: Patient Education method: handout, demo, return demo Education comprehension: verbalized understanding, returned demonstration, and needs further education  HOME EXERCISE PROGRAM: Access Code: [email or web address] URL: [email or web address] Date: 10/18/2022 Prepared by: Margretta Ditty  Exercises - Sit to Stand with Arm Swing  - 2 x daily - 7 x weekly - 1 sets - 10 reps PWR! Exercise in sitting power up  GOALS: Goals reviewed with patient? Yes  SHORT TERM GOALS: Target date: 11/15/22  The patient will be indep with initial HEP. Baseline:Initiated at eval. Goal status: INITIAL  2.  The patient will improve 5 time sit<>stand to < or equal to 10 seconds. Baseline: 13.45 seconds Goal status: INITIAL  3.  The patient will improve gait mechanics demonstrating arm swing with gait in clinic. Baseline: No arm swing with gait. Goal status: INITIAL  4. The patient will report hand pain 0/10.  Baseline: 4/10  Goal status: INITIAL  LONG TERM GOALS: Target date: 12/13/22  The patient will be indep with progression HEP. Baseline: Initiated at eval Goal status: INITIAL  2.  The patient will improve Berg score to > or equal to 48/56 to demo dec'd risk for falls. Baseline: 43/56 Goal status: INITIAL  3.  The patient will improve gait speed to > or equal to 2.8 ft/sec. Baseline:  2.18 ft/sec Goal status: INITIAL  4.  Improve Mini BEST test to > or equal to 20/32. Baseline: 15/32. Goal status: INITIAL  5.  The patient will move floor<>stand mod indep. Baseline:  Notes difficulty. Goal status: INITIAL  6.  The patient will demo outdoor walking mod indep x 600 ft. Baseline: Notes not walking alone due to recent fall. Goal status: INITIAL  ASSESSMENT:  CLINICAL IMPRESSION: Patient is a 72 y.o. male who was seen today for physical  therapy evaluation and treatment for gait instability and Parkinson's Disease. The patient presents with high fall risk per Sharlene Motts, TUG, and mini BEST test + h/o recent fall.  He has Parkinsonian features of shuffling gait, dec'd arm swing, L hand posturing into flexion + tremor, postural rounding/ trunk rigidity, and slowed movement. PT to focus on large amplitude movement training (PWR!) to improve functional mobility and decrease fall risk.    OBJECTIVE IMPAIRMENTS: Abnormal gait, decreased activity tolerance, decreased balance, decreased coordination, impaired flexibility, impaired tone, postural dysfunction, and pain.   ACTIVITY LIMITATIONS: bending, squatting, transfers, and locomotion level  PARTICIPATION LIMITATIONS: community activity and yard work  PERSONAL FACTORS: 1-2 comorbidities: Parkinson's and h/o falls  are also affecting patient's functional outcome.   REHAB POTENTIAL: Good  CLINICAL DECISION MAKING: Stable/uncomplicated  EVALUATION COMPLEXITY: Low  PLAN:  PT FREQUENCY: 2x/week  PT DURATION: 8 weeks  PLANNED INTERVENTIONS: Therapeutic exercises, Therapeutic activity, Neuromuscular re-education, Balance training, Gait training, Patient/Family education, Self Care, Joint mobilization, Taping, and Manual therapy  PLAN FOR NEXT SESSION: PWR! Sitting series, quadriped series when L hand allows. Further develop HEP, gait training emphasizing arm swing (after warm up with PWR trunk rotations).    Ashea Winiarski, PT 10/18/2022, 10:31 AM

## 2022-10-22 ENCOUNTER — Encounter: Payer: Self-pay | Admitting: Physical Therapy

## 2022-10-22 ENCOUNTER — Ambulatory Visit: Payer: Medicare HMO | Admitting: Physical Therapy

## 2022-10-22 DIAGNOSIS — M6281 Muscle weakness (generalized): Secondary | ICD-10-CM | POA: Diagnosis not present

## 2022-10-22 DIAGNOSIS — R2681 Unsteadiness on feet: Secondary | ICD-10-CM | POA: Diagnosis not present

## 2022-10-22 DIAGNOSIS — R2689 Other abnormalities of gait and mobility: Secondary | ICD-10-CM

## 2022-10-22 DIAGNOSIS — G20C Parkinsonism, unspecified: Secondary | ICD-10-CM | POA: Diagnosis not present

## 2022-10-22 NOTE — Therapy (Signed)
OUTPATIENT PHYSICAL THERAPY   Patient Name: Marc Reynolds MRN: [id number] DOB:02/15/1951, 72 y.o., male Today's Date: 10/22/2022  PCP: Everrett Coombe, DO REFERRING PROVIDER: Everrett Coombe, DO  END OF SESSION:  PT End of Session - 10/22/22 0924     Visit Number 2    Number of Visits 16    Date for PT Re-Evaluation 12/17/22    Authorization Type aetna medicare    Authorization - Visit Number 2    Progress Note Due on Visit 10    PT Start Time 0845    PT Stop Time 0924    PT Time Calculation (min) 39 min    Activity Tolerance Patient tolerated treatment well    Behavior During Therapy [facility] for tasks assessed/performed              Past Medical History:  Diagnosis Date   Anxiety    Cancer (HCC)    Prostate 2022   Depression    Neuromuscular disorder (HCC)    Parkinson's 2021   Varicose veins of legs    History reviewed. No pertinent surgical history. Patient Active Problem List   Diagnosis Date Noted   Visit for suture removal 10/15/2022   Primary osteoarthritis of left knee 08/30/2022   Malignant neoplasm of prostate (HCC) 12/04/2020   Well adult exam 12/14/2019   Resting tremor 12/14/2019   Parkinsonism 05/01/2018   Hypertriglyceridemia 09/11/2015   Leukocytosis 09/11/2015   Anxiety and depression 08/11/2015   BPH (benign prostatic hyperplasia) 08/11/2015    ONSET DATE: 10/15/22 REFERRING DIAG: Primary Parkinsonism, balance problem THERAPY DIAG:  Other abnormalities of gait and mobility  Muscle weakness (generalized)  Unsteadiness on feet  Rationale for Evaluation and Treatment: Rehabilitation  SUBJECTIVE:                                                                                                                                                                                             SUBJECTIVE STATEMENT: Pt with no new complaints. Still states his Lt hand hurts "all the time". He has been elevating and exercising hand at home to reduce swelling Pt  accompanied by: self PERTINENT HISTORY: dx Parkinson's 2021, anxiety, depression.  PAIN:  Are you having pain? Yes: NPRS scale: 4/10 Pain location: L hand-- sprained during fall Pain description: sore Aggravating factors: movement Relieving factors: rest PRECAUTIONS: Fall WEIGHT BEARING RESTRICTIONS: No FALLS: Has patient fallen in last 6 months? Yes. Number of falls 1 ; has near falls. LIVING ENVIRONMENT: Lives with: lives with their spouse Lives in: House/apartment Stairs: Yes: External: 1 and 3, depending on door to enter steps; none Has following equipment  at home: None PLOF: Independent PATIENT GOALS: Improve balance, return to independent walking.  OBJECTIVE:   GAIT: Gait pattern: decreased arm swing- Right, decreased arm swing- Left, decreased step length- Right, decreased step length- Left, decreased stance time- Left, decreased stride length, decreased ankle dorsiflexion- Right, decreased ankle dorsiflexion- Left, knee flexed in stance- Right, knee flexed in stance- Left, and shuffling Distance walked: 200 ft Assistive device utilized: None Level of assistance: Complete Independence  FUNCTIONAL TESTS:  5 times sit to stand: 13.45 seconds Timed up and go (TUG): 13.85 seconds TUG cognitive: 17.01 seconds MiniBESTest:  15/32  (<20/32 is fall risk) Berg=43/56 32M: 2.18 ft/sec    [facility] Adult PT Treatment:                                                DATE: 10/22/22  Neuromuscular re-ed: Seated PWR! Up, PWR! Rock repetitions with decreased rest breaks between sets to improve endurance PWR! Sit to stand Standing PWR! Twist Combo of PWR! Sit to stand with PWR! Twist Gait training with focus on arm swing - pt waving pillow cases to emphasize arm swing Coordination tapping dots with LEs in patterns of 3-4 with min A Gait with dots to improve step length with pt able to carry over about 20 feet before step and stride length decreases Backward and sideways walking Seated PWR  Step - added to HEP    [facility] Adult PT Treatment:                                                DATE: 10/18/22 Therapeutic Exercise: PWR! Up exercises in sitting position PWR! Up sit<>stand PWR! In stand with mini squat to tall posture (not yet safe for home)   PATIENT EDUCATION: Education details: HEP Person educated: Patient Education method: handout, demo, return demo Education comprehension: verbalized understanding, returned demonstration, and needs further education  HOME EXERCISE PROGRAM: Access Code: [email or web address] URL: [email or web address] Date: 10/18/2022 Prepared by: Margretta Ditty  Exercises - Sit to Stand with Arm Swing  - 2 x daily - 7 x weekly - 1 sets - 10 reps PWR! Exercise in sitting power up  GOALS: Goals reviewed with patient? Yes  SHORT TERM GOALS: Target date: 11/15/22  The patient will be indep with initial HEP. Baseline:Initiated at eval. Goal status: INITIAL  2.  The patient will improve 5 time sit<>stand to < or equal to 10 seconds. Baseline: 13.45 seconds Goal status: INITIAL  3.  The patient will improve gait mechanics demonstrating arm swing with gait in clinic. Baseline: No arm swing with gait. Goal status: INITIAL  4. The patient will report hand pain 0/10.  Baseline: 4/10  Goal status: INITIAL  LONG TERM GOALS: Target date: 12/13/22  The patient will be indep with progression HEP. Baseline: Initiated at eval Goal status: INITIAL  2.  The patient will improve Berg score to > or equal to 48/56 to demo dec'd risk for falls. Baseline: 43/56 Goal status: INITIAL  3.  The patient will improve gait speed to > or equal to 2.8 ft/sec. Baseline:  2.18 ft/sec Goal status: INITIAL  4.  Improve Mini BEST test to > or equal to 20/32. Baseline: 15/32. Goal status: INITIAL  5.  The patient will move floor<>stand mod indep. Baseline:  Notes difficulty. Goal status: INITIAL  6.  The patient will demo outdoor walking mod indep x  600 ft. Baseline: Notes not walking alone due to recent fall. Goal status: INITIAL  ASSESSMENT:  CLINICAL IMPRESSION: Pt with notable decrease in Lt arm swing during static swinging and during gait. Good response to interventions today. Added PWR! Step to HEP to improve turning during gait  OBJECTIVE IMPAIRMENTS: Abnormal gait, decreased activity tolerance, decreased balance, decreased coordination, impaired flexibility, impaired tone, postural dysfunction, and pain.     PLAN:  PT FREQUENCY: 2x/week  PT DURATION: 8 weeks  PLANNED INTERVENTIONS: Therapeutic exercises, Therapeutic activity, Neuromuscular re-education, Balance training, Gait training, Patient/Family education, Self Care, Joint mobilization, Taping, and Manual therapy  PLAN FOR NEXT SESSION: PWR! Sitting series, quadriped series when L hand allows. Further develop HEP, gait training emphasizing arm swing (after warm up with PWR trunk rotations).    Olla Delancey, PT 10/22/2022, 9:25 AM

## 2022-10-25 ENCOUNTER — Ambulatory Visit: Payer: Medicare HMO | Admitting: Physical Therapy

## 2022-10-25 ENCOUNTER — Encounter: Payer: Self-pay | Admitting: Physical Therapy

## 2022-10-25 DIAGNOSIS — R2689 Other abnormalities of gait and mobility: Secondary | ICD-10-CM

## 2022-10-25 DIAGNOSIS — M6281 Muscle weakness (generalized): Secondary | ICD-10-CM | POA: Diagnosis not present

## 2022-10-25 DIAGNOSIS — R2681 Unsteadiness on feet: Secondary | ICD-10-CM

## 2022-10-25 DIAGNOSIS — G20C Parkinsonism, unspecified: Secondary | ICD-10-CM | POA: Diagnosis not present

## 2022-10-25 NOTE — Therapy (Signed)
OUTPATIENT PHYSICAL THERAPY   Patient Name: Marc Reynolds MRN: [id number] DOB:14-Jul-1950, 72 y.o., male Today's Date: 10/25/2022  PCP: Everrett Coombe, DO REFERRING PROVIDER: Everrett Coombe, DO  END OF SESSION:  PT End of Session - 10/25/22 1012     Visit Number 3    Number of Visits 16    Date for PT Re-Evaluation 12/17/22    Authorization - Visit Number 3    Progress Note Due on Visit 10    PT Start Time 0925    PT Stop Time 1010    PT Time Calculation (min) 45 min    Activity Tolerance Patient tolerated treatment well    Behavior During Therapy [facility] for tasks assessed/performed               Past Medical History:  Diagnosis Date   Anxiety    Cancer (HCC)    Prostate 2022   Depression    Neuromuscular disorder (HCC)    Parkinson's 2021   Varicose veins of legs    History reviewed. No pertinent surgical history. Patient Active Problem List   Diagnosis Date Noted   Visit for suture removal 10/15/2022   Primary osteoarthritis of left knee 08/30/2022   Malignant neoplasm of prostate (HCC) 12/04/2020   Well adult exam 12/14/2019   Resting tremor 12/14/2019   Parkinsonism 05/01/2018   Hypertriglyceridemia 09/11/2015   Leukocytosis 09/11/2015   Anxiety and depression 08/11/2015   BPH (benign prostatic hyperplasia) 08/11/2015    ONSET DATE: 10/15/22 REFERRING DIAG: Primary Parkinsonism, balance problem THERAPY DIAG:  Other abnormalities of gait and mobility  Muscle weakness (generalized)  Unsteadiness on feet  Rationale for Evaluation and Treatment: Rehabilitation  SUBJECTIVE:                                                                                                                                                                                             SUBJECTIVE STATEMENT: Pt with no new complaints. Still states his Lt hand hurts "all the time". He has been elevating and exercising hand at home to reduce swelling Pt accompanied by: self PERTINENT  HISTORY: dx Parkinson's 2021, anxiety, depression.  PAIN:  Are you having pain? Yes: NPRS scale: 4/10 Pain location: L hand-- sprained during fall Pain description: sore Aggravating factors: movement Relieving factors: rest PRECAUTIONS: Fall WEIGHT BEARING RESTRICTIONS: No FALLS: Has patient fallen in last 6 months? Yes. Number of falls 1 ; has near falls. LIVING ENVIRONMENT: Lives with: lives with their spouse Lives in: House/apartment Stairs: Yes: External: 1 and 3, depending on door to enter steps; none Has following equipment at home: None PLOF: Independent PATIENT  GOALS: Improve balance, return to independent walking.  OBJECTIVE:   GAIT: Gait pattern: decreased arm swing- Right, decreased arm swing- Left, decreased step length- Right, decreased step length- Left, decreased stance time- Left, decreased stride length, decreased ankle dorsiflexion- Right, decreased ankle dorsiflexion- Left, knee flexed in stance- Right, knee flexed in stance- Left, and shuffling Distance walked: 200 ft Assistive device utilized: None Level of assistance: Complete Independence  FUNCTIONAL TESTS:  5 times sit to stand: 13.45 seconds Timed up and go (TUG): 13.85 seconds TUG cognitive: 17.01 seconds MiniBESTest:  15/32  (<20/32 is fall risk) Berg=43/56 45M: 2.18 ft/sec   [facility] Adult PT Treatment:                                                DATE: 5//10/24  Neuromuscular re-ed: Seated PWR! Up, PWR! Rock repetitions with decreased rest breaks between sets to improve endurance PWR! Sit to stand Standing reaching all directions for different numbered dots with focus on full trunk extension and coordination with 3-4 number sequences Gait training forward and backward with focus on arm swing - pt waving pillow cases to emphasize arm swing Standing PWR! Step Gait training with obstacles focus on maintaining big steps with turns Rockerboard A/P and laterally with mod cuing for maintaining large wt  shifts Modified PWR! Up in standing with physioball touching floor and reaching up PWR! Rock high and low with physioball reach   [facility] Adult PT Treatment:                                                DATE: 10/22/22  Neuromuscular re-ed: Seated PWR! Up, PWR! Rock repetitions with decreased rest breaks between sets to improve endurance PWR! Sit to stand Standing PWR! Twist Combo of PWR! Sit to stand with PWR! Twist Gait training with focus on arm swing - pt waving pillow cases to emphasize arm swing Coordination tapping dots with LEs in patterns of 3-4 with min A Gait with dots to improve step length with pt able to carry over about 20 feet before step and stride length decreases Backward and sideways walking Seated PWR Step - added to HEP    [facility] Adult PT Treatment:                                                DATE: 10/18/22 Therapeutic Exercise: PWR! Up exercises in sitting position PWR! Up sit<>stand PWR! In stand with mini squat to tall posture (not yet safe for home)   PATIENT EDUCATION: Education details: HEP Person educated: Patient Education method: handout, demo, return demo Education comprehension: verbalized understanding, returned demonstration, and needs further education  HOME EXERCISE PROGRAM: Access Code: [email or web address] URL: [email or web address] Date: 10/18/2022 Prepared by: Margretta Ditty  Exercises - Sit to Stand with Arm Swing  - 2 x daily - 7 x weekly - 1 sets - 10 reps PWR! Exercise in sitting power up  GOALS: Goals reviewed with patient? Yes  SHORT TERM GOALS: Target date: 11/15/22  The patient will be indep with initial HEP. Baseline:Initiated at eval. Goal status: INITIAL  2.  The patient will improve 5 time sit<>stand to < or equal to 10 seconds. Baseline: 13.45 seconds Goal status: INITIAL  3.  The patient will improve gait mechanics demonstrating arm swing with gait in clinic. Baseline: No arm swing with gait. Goal status:  INITIAL  4. The patient will report hand pain 0/10.  Baseline: 4/10  Goal status: INITIAL  LONG TERM GOALS: Target date: 12/13/22  The patient will be indep with progression HEP. Baseline: Initiated at eval Goal status: INITIAL  2.  The patient will improve Berg score to > or equal to 48/56 to demo dec'd risk for falls. Baseline: 43/56 Goal status: INITIAL  3.  The patient will improve gait speed to > or equal to 2.8 ft/sec. Baseline:  2.18 ft/sec Goal status: INITIAL  4.  Improve Mini BEST test to > or equal to 20/32. Baseline: 15/32. Goal status: INITIAL  5.  The patient will move floor<>stand mod indep. Baseline:  Notes difficulty. Goal status: INITIAL  6.  The patient will demo outdoor walking mod indep x 600 ft. Baseline: Notes not walking alone due to recent fall. Goal status: INITIAL  ASSESSMENT:  CLINICAL IMPRESSION: Pt continues with good response to exercise progression. He continues to have forward stepping response with prolonged anterior wt shift which he reports makes tasks such as washing dishes difficult. Incorporated standing forward reaching to address this with occasional CGA/min A for anterior LOB  OBJECTIVE IMPAIRMENTS: Abnormal gait, decreased activity tolerance, decreased balance, decreased coordination, impaired flexibility, impaired tone, postural dysfunction, and pain.     PLAN:  PT FREQUENCY: 2x/week  PT DURATION: 8 weeks  PLANNED INTERVENTIONS: Therapeutic exercises, Therapeutic activity, Neuromuscular re-education, Balance training, Gait training, Patient/Family education, Self Care, Joint mobilization, Taping, and Manual therapy  PLAN FOR NEXT SESSION: PWR! Sitting series, quadriped series when L hand allows. Further develop HEP, gait training emphasizing arm swing (after warm up with PWR trunk rotations).    Alaisha Eversley, PT 10/25/2022, 10:13 AM

## 2022-10-29 ENCOUNTER — Ambulatory Visit: Payer: Medicare HMO | Admitting: Physical Therapy

## 2022-10-29 ENCOUNTER — Encounter: Payer: Self-pay | Admitting: Physical Therapy

## 2022-10-29 DIAGNOSIS — R2689 Other abnormalities of gait and mobility: Secondary | ICD-10-CM | POA: Diagnosis not present

## 2022-10-29 DIAGNOSIS — R2681 Unsteadiness on feet: Secondary | ICD-10-CM | POA: Diagnosis not present

## 2022-10-29 DIAGNOSIS — M6281 Muscle weakness (generalized): Secondary | ICD-10-CM

## 2022-10-29 DIAGNOSIS — G20C Parkinsonism, unspecified: Secondary | ICD-10-CM | POA: Diagnosis not present

## 2022-10-29 NOTE — Therapy (Signed)
OUTPATIENT PHYSICAL THERAPY   Patient Name: Marc Reynolds MRN: [id number] DOB:03-Oct-1950, 72 y.o., male Today's Date: 10/29/2022  PCP: Everrett Coombe, DO REFERRING PROVIDER: Everrett Coombe, DO  END OF SESSION:  PT End of Session - 10/29/22 0925     Visit Number 4    Number of Visits 16    Date for PT Re-Evaluation 12/17/22    Authorization - Visit Number 4    Progress Note Due on Visit 10    PT Start Time 0847    PT Stop Time 0926    PT Time Calculation (min) 39 min    Activity Tolerance Patient tolerated treatment well    Behavior During Therapy [facility] for tasks assessed/performed                Past Medical History:  Diagnosis Date   Anxiety    Cancer [facility])    Prostate 2022   Depression    Neuromuscular disorder (HCC)    Parkinson's 2021   Varicose veins of legs    History reviewed. No pertinent surgical history. Patient Active Problem List   Diagnosis Date Noted   Visit for suture removal 10/15/2022   Primary osteoarthritis of left knee 08/30/2022   Malignant neoplasm of prostate (HCC) 12/04/2020   Well adult exam 12/14/2019   Resting tremor 12/14/2019   Parkinsonism 05/01/2018   Hypertriglyceridemia 09/11/2015   Leukocytosis 09/11/2015   Anxiety and depression 08/11/2015   BPH (benign prostatic hyperplasia) 08/11/2015    ONSET DATE: 10/15/22 REFERRING DIAG: Primary Parkinsonism, balance problem THERAPY DIAG:  Other abnormalities of gait and mobility  Muscle weakness (generalized)  Unsteadiness on feet  Rationale for Evaluation and Treatment: Rehabilitation  SUBJECTIVE:                                                                                                                                                                                             SUBJECTIVE STATEMENT: Pt states he fell asleep while standing up and shaving this morning and that he fell down, no injury Pt accompanied by: self PERTINENT HISTORY: dx Parkinson's 2021, anxiety,  depression.  PAIN:  Are you having pain? Yes: NPRS scale: 4/10 Pain location: L hand-- sprained during fall Pain description: sore Aggravating factors: movement Relieving factors: rest PRECAUTIONS: Fall WEIGHT BEARING RESTRICTIONS: No FALLS: Has patient fallen in last 6 months? Yes. Number of falls 1 ; has near falls. LIVING ENVIRONMENT: Lives with: lives with their spouse Lives in: House/apartment Stairs: Yes: External: 1 and 3, depending on door to enter steps; none Has following equipment at home: None PLOF: Independent PATIENT GOALS: Improve balance, return to independent  walking.  OBJECTIVE:   GAIT: Gait pattern: decreased arm swing- Right, decreased arm swing- Left, decreased step length- Right, decreased step length- Left, decreased stance time- Left, decreased stride length, decreased ankle dorsiflexion- Right, decreased ankle dorsiflexion- Left, knee flexed in stance- Right, knee flexed in stance- Left, and shuffling Distance walked: 200 ft Assistive device utilized: None Level of assistance: Complete Independence  FUNCTIONAL TESTS:  5 times sit to stand: 13.45 seconds Timed up and go (TUG): 13.85 seconds TUG cognitive: 17.01 seconds MiniBESTest:  15/32  (<20/32 is fall risk) Berg=43/56 72M: 2.18 ft/sec   [facility] Adult PT Treatment:                                                DATE: 10/29/22  Neuromuscular re-ed: Seated PWR! Up, PWR! Twist, then combo to improve endurance Gait with gait ladder focus on increasing step length for forward and sideways gait Backward gait with cuing for arm swing and CGA for quickly changing direction backward to forward Ball tap down, up, sideways for forward wt shift and trunk mobility Coordination box step CGA, transition to square stepping with turns Standing PWR! Step laterally and then forward PWR! Up with sit to stand    [facility] Adult PT Treatment:                                                DATE: 5//10/24  Neuromuscular  re-ed: Seated PWR! Up, PWR! Rock repetitions with decreased rest breaks between sets to improve endurance PWR! Sit to stand Standing reaching all directions for different numbered dots with focus on full trunk extension and coordination with 3-4 number sequences Gait training forward and backward with focus on arm swing - pt waving pillow cases to emphasize arm swing Standing PWR! Step Gait training with obstacles focus on maintaining big steps with turns Rockerboard A/P and laterally with mod cuing for maintaining large wt shifts Modified PWR! Up in standing with physioball touching floor and reaching up PWR! Rock high and low with physioball reach   [facility] Adult PT Treatment:                                                DATE: 10/22/22  Neuromuscular re-ed: Seated PWR! Up, PWR! Rock repetitions with decreased rest breaks between sets to improve endurance PWR! Sit to stand Standing PWR! Twist Combo of PWR! Sit to stand with PWR! Twist Gait training with focus on arm swing - pt waving pillow cases to emphasize arm swing Coordination tapping dots with LEs in patterns of 3-4 with min A Gait with dots to improve step length with pt able to carry over about 20 feet before step and stride length decreases Backward and sideways walking Seated PWR Step - added to HEP   PATIENT EDUCATION: Education details: HEP Person educated: Patient Education method: handout, Manufacturing engineer, return demo Education comprehension: verbalized understanding, returned demonstration, and needs further education  HOME EXERCISE PROGRAM: Access Code: [email or web address] URL: [email or web address] Date: 10/18/2022 Prepared by: Margretta Ditty  Exercises - Sit to Stand with Arm Swing  - 2 x daily -  7 x weekly - 1 sets - 10 reps PWR! Exercise in sitting power up  GOALS: Goals reviewed with patient? Yes  SHORT TERM GOALS: Target date: 11/15/22  The patient will be indep with initial HEP. Baseline:Initiated at  eval. Goal status: INITIAL  2.  The patient will improve 5 time sit<>stand to < or equal to 10 seconds. Baseline: 13.45 seconds Goal status: INITIAL  3.  The patient will improve gait mechanics demonstrating arm swing with gait in clinic. Baseline: No arm swing with gait. Goal status: INITIAL  4. The patient will report hand pain 0/10.  Baseline: 4/10  Goal status: INITIAL  LONG TERM GOALS: Target date: 12/13/22  The patient will be indep with progression HEP. Baseline: Initiated at eval Goal status: INITIAL  2.  The patient will improve Berg score to > or equal to 48/56 to demo dec'd risk for falls. Baseline: 43/56 Goal status: INITIAL  3.  The patient will improve gait speed to > or equal to 2.8 ft/sec. Baseline:  2.18 ft/sec Goal status: INITIAL  4.  Improve Mini BEST test to > or equal to 20/32. Baseline: 15/32. Goal status: INITIAL  5.  The patient will move floor<>stand mod indep. Baseline:  Notes difficulty. Goal status: INITIAL  6.  The patient will demo outdoor walking mod indep x 600 ft. Baseline: Notes not walking alone due to recent fall. Goal status: INITIAL  ASSESSMENT:  CLINICAL IMPRESSION: Good performance with box stepping, continues to be challenged by reaching forward and by stepping forward  OBJECTIVE IMPAIRMENTS: Abnormal gait, decreased activity tolerance, decreased balance, decreased coordination, impaired flexibility, impaired tone, postural dysfunction, and pain.     PLAN:  PT FREQUENCY: 2x/week  PT DURATION: 8 weeks  PLANNED INTERVENTIONS: Therapeutic exercises, Therapeutic activity, Neuromuscular re-education, Balance training, Gait training, Patient/Family education, Self Care, Joint mobilization, Taping, and Manual therapy  PLAN FOR NEXT SESSION: PWR! Sitting series, quadriped series when L hand allows.Gait, balance, coordination   Shianne Zeiser, PT 10/29/2022, 9:26 AM

## 2022-11-01 ENCOUNTER — Ambulatory Visit: Payer: Medicare HMO | Admitting: Physical Therapy

## 2022-11-01 ENCOUNTER — Encounter: Payer: Self-pay | Admitting: Physical Therapy

## 2022-11-01 DIAGNOSIS — R2689 Other abnormalities of gait and mobility: Secondary | ICD-10-CM | POA: Diagnosis not present

## 2022-11-01 DIAGNOSIS — G20C Parkinsonism, unspecified: Secondary | ICD-10-CM | POA: Diagnosis not present

## 2022-11-01 DIAGNOSIS — R2681 Unsteadiness on feet: Secondary | ICD-10-CM | POA: Diagnosis not present

## 2022-11-01 DIAGNOSIS — M6281 Muscle weakness (generalized): Secondary | ICD-10-CM

## 2022-11-01 NOTE — Therapy (Signed)
OUTPATIENT PHYSICAL THERAPY   Patient Name: Marc Reynolds MRN: [id number] DOB:[birth year], 72 y.o., male Today's Date: 11/01/2022  PCP: Everrett Coombe, DO REFERRING PROVIDER: Everrett Coombe, DO  END OF SESSION:  PT End of Session - 11/01/22 1013     Visit Number 5    Number of Visits 16    Date for PT Re-Evaluation 12/17/22    Authorization Type [employer] medicare    Authorization - Visit Number 5    Progress Note Due on Visit 10    PT Start Time 0930    PT Stop Time 1012    PT Time Calculation (min) 42 min    Activity Tolerance Patient tolerated treatment well    Behavior During Therapy [facility] for tasks assessed/performed                 Past Medical History:  Diagnosis Date   Anxiety    Cancer (HCC)    Prostate 2022   Depression    Neuromuscular disorder (HCC)    Parkinson's 2021   Varicose veins of legs    History reviewed. No pertinent surgical history. Patient Active Problem List   Diagnosis Date Noted   Visit for suture removal 10/15/2022   Primary osteoarthritis of left knee 08/30/2022   Malignant neoplasm of prostate (HCC) 12/04/2020   Well adult exam 12/14/2019   Resting tremor 12/14/2019   Parkinsonism 05/01/2018   Hypertriglyceridemia 09/11/2015   Leukocytosis 09/11/2015   Anxiety and depression 08/11/2015   BPH (benign prostatic hyperplasia) 08/11/2015    ONSET DATE: 10/15/22 REFERRING DIAG: Primary Parkinsonism, balance problem THERAPY DIAG:  Other abnormalities of gait and mobility  Muscle weakness (generalized)  Unsteadiness on feet  Rationale for Evaluation and Treatment: Rehabilitation  SUBJECTIVE:                                                                                                                                                                                             SUBJECTIVE STATEMENT: Pt states his hand is feeling better, still swollen but less pain. He states he will discuss drowsiness with MD next week Pt accompanied  by: self PERTINENT HISTORY: dx Parkinson's 2021, anxiety, depression.  PAIN:  Are you having pain? Yes: NPRS scale: 4/10 Pain location: L hand-- sprained during fall Pain description: sore Aggravating factors: movement Relieving factors: rest PRECAUTIONS: Fall WEIGHT BEARING RESTRICTIONS: No FALLS: Has patient fallen in last 6 months? Yes. Number of falls 1 ; has near falls. LIVING ENVIRONMENT: Lives with: lives with their spouse Lives in: House/apartment Stairs: Yes: External: 1 and 3, depending on door to enter steps; none Has following equipment at  home: None PLOF: Independent PATIENT GOALS: Improve balance, return to independent walking.  OBJECTIVE:   GAIT: Gait pattern: decreased arm swing- Right, decreased arm swing- Left, decreased step length- Right, decreased step length- Left, decreased stance time- Left, decreased stride length, decreased ankle dorsiflexion- Right, decreased ankle dorsiflexion- Left, knee flexed in stance- Right, knee flexed in stance- Left, and shuffling Distance walked: 200 ft Assistive device utilized: None Level of assistance: Complete Independence  FUNCTIONAL TESTS:  5 times sit to stand: 13.45 seconds Timed up and go (TUG): 13.85 seconds TUG cognitive: 17.01 seconds MiniBESTest:  15/32  (<20/32 is fall risk) Berg=43/56 51M: 2.18 ft/sec   [facility] Adult PT Treatment:                                                DATE: 11/01/22  Neuromuscular re-ed: Seated PWR! Up, PWR! Twist Standing PWR! Twist Standing squat into trunk extention holding phyioball for forward wt shift and trunk extension Standing modified PWR! Twist with physioball for trunk ext and rotation Gait forward/backward holding pool noodles to improve arm swing and working on changing directions - min A Tapping feet forward/backward to targets with continued work on transitional movements Ball roll and reach for balance Gait dribbling ball with min A for balance reactions PWR! Up  sit <> stand into standing PWR! step  [facility] Adult PT Treatment:                                                DATE: 10/29/22  Neuromuscular re-ed: Seated PWR! Up, PWR! Twist, then combo to improve endurance Gait with gait ladder focus on increasing step length for forward and sideways gait Backward gait with cuing for arm swing and CGA for quickly changing direction backward to forward Ball tap down, up, sideways for forward wt shift and trunk mobility Coordination box step CGA, transition to square stepping with turns Standing PWR! Step laterally and then forward PWR! Up with sit to stand    [facility] Adult PT Treatment:                                                DATE: 5//10/24  Neuromuscular re-ed: Seated PWR! Up, PWR! Rock repetitions with decreased rest breaks between sets to improve endurance PWR! Sit to stand Standing reaching all directions for different numbered dots with focus on full trunk extension and coordination with 3-4 number sequences Gait training forward and backward with focus on arm swing - pt waving pillow cases to emphasize arm swing Standing PWR! Step Gait training with obstacles focus on maintaining big steps with turns Rockerboard A/P and laterally with mod cuing for maintaining large wt shifts Modified PWR! Up in standing with physioball touching floor and reaching up PWR! Rock high and low with physioball reach    PATIENT EDUCATION: Education details: HEP Person educated: Patient Education method: handout, demo, return demo Education comprehension: verbalized understanding, returned demonstration, and needs further education  HOME EXERCISE PROGRAM: Access Code: [email or web address] URL: [email or web address] Date: 10/18/2022 Prepared by: Margretta Ditty  Exercises - Sit to Stand with Arm Swing  -  2 x daily - 7 x weekly - 1 sets - 10 reps PWR! Exercise in sitting power up  GOALS: Goals reviewed with patient? Yes  SHORT TERM GOALS: Target  date: 11/15/22  The patient will be indep with initial HEP. Baseline:Initiated at eval. Goal status: INITIAL  2.  The patient will improve 5 time sit<>stand to < or equal to 10 seconds. Baseline: 13.45 seconds Goal status: INITIAL  3.  The patient will improve gait mechanics demonstrating arm swing with gait in clinic. Baseline: No arm swing with gait. Goal status: INITIAL  4. The patient will report hand pain 0/10.  Baseline: 4/10  Goal status: INITIAL  LONG TERM GOALS: Target date: 12/13/22  The patient will be indep with progression HEP. Baseline: Initiated at eval Goal status: INITIAL  2.  The patient will improve Berg score to > or equal to 48/56 to demo dec'd risk for falls. Baseline: 43/56 Goal status: INITIAL  3.  The patient will improve gait speed to > or equal to 2.8 ft/sec. Baseline:  2.18 ft/sec Goal status: INITIAL  4.  Improve Mini BEST test to > or equal to 20/32. Baseline: 15/32. Goal status: INITIAL  5.  The patient will move floor<>stand mod indep. Baseline:  Notes difficulty. Goal status: INITIAL  6.  The patient will demo outdoor walking mod indep x 600 ft. Baseline: Notes not walking alone due to recent fall. Goal status: INITIAL  ASSESSMENT:  CLINICAL IMPRESSION: Pt with frequent LOB during transitions from forward to backward walking and stepping, focused on improving balance in the sagittal plane. Pt is improving twisting motions and has improved trunk mobility  OBJECTIVE IMPAIRMENTS: Abnormal gait, decreased activity tolerance, decreased balance, decreased coordination, impaired flexibility, impaired tone, postural dysfunction, and pain.     PLAN:  PT FREQUENCY: 2x/week  PT DURATION: 8 weeks  PLANNED INTERVENTIONS: Therapeutic exercises, Therapeutic activity, Neuromuscular re-education, Balance training, Gait training, Patient/Family education, Self Care, Joint mobilization, Taping, and Manual therapy  PLAN FOR NEXT SESSION: Retest  5x STS, mini best, PWR! Sitting and standingGait, balance, coordination   Jemel Ono, PT 11/01/2022, 10:13 AM

## 2022-11-05 ENCOUNTER — Encounter: Payer: Self-pay | Admitting: Physical Therapy

## 2022-11-05 ENCOUNTER — Ambulatory Visit: Payer: Medicare HMO | Admitting: Physical Therapy

## 2022-11-05 DIAGNOSIS — R2689 Other abnormalities of gait and mobility: Secondary | ICD-10-CM

## 2022-11-05 DIAGNOSIS — R2681 Unsteadiness on feet: Secondary | ICD-10-CM | POA: Diagnosis not present

## 2022-11-05 DIAGNOSIS — M6281 Muscle weakness (generalized): Secondary | ICD-10-CM | POA: Diagnosis not present

## 2022-11-05 DIAGNOSIS — G20C Parkinsonism, unspecified: Secondary | ICD-10-CM | POA: Diagnosis not present

## 2022-11-05 NOTE — Therapy (Signed)
OUTPATIENT PHYSICAL THERAPY   Patient Name: Marc Reynolds MRN: [id number] DOB:[birth year], 72 y.o., male Today's Date: 11/05/2022  PCP: Everrett Coombe, DO REFERRING PROVIDER: Everrett Coombe, DO  END OF SESSION:  PT End of Session - 11/05/22 0931     Visit Number 6    Number of Visits 16    Date for PT Re-Evaluation 12/17/22    Authorization Type aetna medicare    Authorization - Visit Number 6    Progress Note Due on Visit 10    PT Start Time 0845    PT Stop Time 0928    PT Time Calculation (min) 43 min    Activity Tolerance Patient tolerated treatment well    Behavior During Therapy [facility] for tasks assessed/performed                  Past Medical History:  Diagnosis Date   Anxiety    Cancer (HCC)    Prostate 2022   Depression    Neuromuscular disorder (HCC)    Parkinson's 2021   Varicose veins of legs    History reviewed. No pertinent surgical history. Patient Active Problem List   Diagnosis Date Noted   Visit for suture removal 10/15/2022   Primary osteoarthritis of left knee 08/30/2022   Malignant neoplasm of prostate (HCC) 12/04/2020   Well adult exam 12/14/2019   Resting tremor 12/14/2019   Parkinsonism 05/01/2018   Hypertriglyceridemia 09/11/2015   Leukocytosis 09/11/2015   Anxiety and depression 08/11/2015   BPH (benign prostatic hyperplasia) 08/11/2015    ONSET DATE: 10/15/22 REFERRING DIAG: Primary Parkinsonism, balance problem THERAPY DIAG:  Other abnormalities of gait and mobility  Unsteadiness on feet  Muscle weakness (generalized)  Rationale for Evaluation and Treatment: Rehabilitation  SUBJECTIVE:                                                                                                                                                                                             SUBJECTIVE STATEMENT: Pt states he feels "stiff" today. He states his hand still hurts but is much less swollen Pt accompanied by: self PERTINENT HISTORY: dx  Parkinson's 2021, anxiety, depression.  PAIN:  Are you having pain? Yes: NPRS scale: 4/10 Pain location: L hand-- sprained during fall Pain description: sore Aggravating factors: movement Relieving factors: rest PRECAUTIONS: Fall WEIGHT BEARING RESTRICTIONS: No FALLS: Has patient fallen in last 6 months? Yes. Number of falls 1 ; has near falls. LIVING ENVIRONMENT: Lives with: lives with their spouse Lives in: House/apartment Stairs: Yes: External: 1 and 3, depending on door to enter steps; none Has following equipment at home: None PLOF: Independent  PATIENT GOALS: Improve balance, return to independent walking.  OBJECTIVE:   GAIT: Gait pattern: decreased arm swing- Right, decreased arm swing- Left, decreased step length- Right, decreased step length- Left, decreased stance time- Left, decreased stride length, decreased ankle dorsiflexion- Right, decreased ankle dorsiflexion- Left, knee flexed in stance- Right, knee flexed in stance- Left, and shuffling Distance walked: 200 ft Assistive device utilized: None Level of assistance: Complete Independence  FUNCTIONAL TESTS:  5 times sit to stand: 13.45 seconds (eval)  8.09 sec (11/05/22) Timed up and go (TUG): 13.85 seconds (eval) 13.02 sec (11/05/22) TUG cognitive: 17.01 seconds (eval) 15.5 sec (11/05/22) MiniBESTest:  15/32  (<20/32 is fall risk) (eval) 18/32 (11/05/22) Berg=43/56 50M: 2.18 ft/sec   [facility] Adult PT Treatment:                                                DATE: 11/05/22  Neuromuscular re-ed: PWR! Up sit to stand, PWR! Twist for warm up Restest 5x STS = 8.49 sec Retest miniBest = 18/32 Standing balance on foam with head turns all directions Standing balance on foam narrow BOS - min A Standing physioball catch and reach for trunk extension and forward reaching Standing PWR! Step Standing trunk diagonals with focus on improving trunk mobility and wt shifts   [facility] Adult PT Treatment:                                                 DATE: 11/01/22  Neuromuscular re-ed: Seated PWR! Up, PWR! Twist Standing PWR! Twist Standing squat into trunk extention holding phyioball for forward wt shift and trunk extension Standing modified PWR! Twist with physioball for trunk ext and rotation Gait forward/backward holding pool noodles to improve arm swing and working on changing directions - min A Tapping feet forward/backward to targets with continued work on transitional movements Ball roll and reach for balance Gait dribbling ball with min A for balance reactions PWR! Up sit <> stand into standing PWR! step  [facility] Adult PT Treatment:                                                DATE: 10/29/22  Neuromuscular re-ed: Seated PWR! Up, PWR! Twist, then combo to improve endurance Gait with gait ladder focus on increasing step length for forward and sideways gait Backward gait with cuing for arm swing and CGA for quickly changing direction backward to forward Ball tap down, up, sideways for forward wt shift and trunk mobility Coordination box step CGA, transition to square stepping with turns Standing PWR! Step laterally and then forward PWR! Up with sit to stand   PATIENT EDUCATION: Education details: HEP Person educated: Patient Education method: handout, demo, return demo Education comprehension: verbalized understanding, returned demonstration, and needs further education  HOME EXERCISE PROGRAM: Access Code: [email or web address] URL: [email or web address] Date: 10/18/2022 Prepared by: Margretta Ditty  Exercises - Sit to Stand with Arm Swing  - 2 x daily - 7 x weekly - 1 sets - 10 reps PWR! Exercise in sitting power up Access Code: [email or web address] URL: [email or web address] Date: 11/05/2022 Prepared by: Clydie Braun  Breeonna Mone  Exercises - Corner Balance Feet Apart: Eyes Open With Head Turns  - 1 x daily - 7 x weekly - 3 sets - 10 reps  GOALS: Goals reviewed with patient? Yes  SHORT TERM GOALS:  Target date: 11/15/22  The patient will be indep with initial HEP. Baseline:Initiated at eval. Goal status: INITIAL  2.  The patient will improve 5 time sit<>stand to < or equal to 10 seconds. Baseline: 13.45 seconds Goal status: MET  3.  The patient will improve gait mechanics demonstrating arm swing with gait in clinic. Baseline: No arm swing with gait. Goal status: INITIAL  4. The patient will report hand pain 0/10.  Baseline: 4/10  Goal status: INITIAL  LONG TERM GOALS: Target date: 12/13/22  The patient will be indep with progression HEP. Baseline: Initiated at eval Goal status: INITIAL  2.  The patient will improve Berg score to > or equal to 48/56 to demo dec'd risk for falls. Baseline: 43/56 Goal status: INITIAL  3.  The patient will improve gait speed to > or equal to 2.8 ft/sec. Baseline:  2.18 ft/sec Goal status: INITIAL  4.  Improve Mini BEST test to > or equal to 20/32. Baseline: 15/32. Goal status: INITIAL  5.  The patient will move floor<>stand mod indep. Baseline:  Notes difficulty. Goal status: INITIAL  6.  The patient will demo outdoor walking mod indep x 600 ft. Baseline: Notes not walking alone due to recent fall. Goal status: INITIAL  ASSESSMENT:  CLINICAL IMPRESSION: Pt with improved score on both the 5x STS and miniBest test. Pt with most difficulty during balance and reaction portion of miniBest so treatment focused on balance training. Updated HEP   OBJECTIVE IMPAIRMENTS: Abnormal gait, decreased activity tolerance, decreased balance, decreased coordination, impaired flexibility, impaired tone, postural dysfunction, and pain.     PLAN:  PT FREQUENCY: 2x/week  PT DURATION: 8 weeks  PLANNED INTERVENTIONS: Therapeutic exercises, Therapeutic activity, Neuromuscular re-education, Balance training, Gait training, Patient/Family education, Self Care, Joint mobilization, Taping, and Manual therapy  PLAN FOR NEXT SESSION: PWR! Sitting and  standingGait, balance, coordination   Windell Musson, PT 11/05/2022, 9:32 AM

## 2022-11-07 ENCOUNTER — Ambulatory Visit (INDEPENDENT_AMBULATORY_CARE_PROVIDER_SITE_OTHER): Payer: Medicare HMO | Admitting: Family Medicine

## 2022-11-07 ENCOUNTER — Encounter: Payer: Self-pay | Admitting: Family Medicine

## 2022-11-07 VITALS — BP 133/84 | HR 73 | Ht 68.0 in | Wt 176.0 lb

## 2022-11-07 DIAGNOSIS — E781 Pure hyperglyceridemia: Secondary | ICD-10-CM

## 2022-11-07 DIAGNOSIS — G20C Parkinsonism, unspecified: Secondary | ICD-10-CM | POA: Diagnosis not present

## 2022-11-07 DIAGNOSIS — M79672 Pain in left foot: Secondary | ICD-10-CM

## 2022-11-07 DIAGNOSIS — Z0000 Encounter for general adult medical examination without abnormal findings: Secondary | ICD-10-CM

## 2022-11-07 DIAGNOSIS — B351 Tinea unguium: Secondary | ICD-10-CM

## 2022-11-07 LAB — CBC WITH DIFFERENTIAL/PLATELET
Basophils Relative: 0.5 %
Eosinophils Absolute: 100 cells/uL (ref 15–500)
MCH: 29.1 pg (ref 27.0–33.0)
MCHC: 32.9 g/dL (ref 32.0–36.0)
Monocytes Relative: 6.7 %
RBC: 4.91 10*6/uL (ref 4.20–5.80)
RDW: 12.7 % (ref 11.0–15.0)

## 2022-11-07 NOTE — Assessment & Plan Note (Signed)
Continue PT.  Follow up with neurology.

## 2022-11-07 NOTE — Patient Instructions (Signed)
Preventive Care 65 Years and Older, Male Preventive care refers to lifestyle choices and visits with your health care provider that can promote health and wellness. Preventive care visits are also called wellness exams. What can I expect for my preventive care visit? Counseling During your preventive care visit, your health care provider may ask about your: Medical history, including: Past medical problems. Family medical history. History of falls. Current health, including: Emotional well-being. Home life and relationship well-being. Sexual activity. Memory and ability to understand (cognition). Lifestyle, including: Alcohol, nicotine or tobacco, and drug use. Access to firearms. Diet, exercise, and sleep habits. Work and work environment. Sunscreen use. Safety issues such as seatbelt and bike helmet use. Physical exam Your health care provider will check your: Height and weight. These may be used to calculate your BMI (body mass index). BMI is a measurement that tells if you are at a healthy weight. Waist circumference. This measures the distance around your waistline. This measurement also tells if you are at a healthy weight and may help predict your risk of certain diseases, such as type 2 diabetes and high blood pressure. Heart rate and blood pressure. Body temperature. Skin for abnormal spots. What immunizations do I need?  Vaccines are usually given at various ages, according to a schedule. Your health care provider will recommend vaccines for you based on your age, medical history, and lifestyle or other factors, such as travel or where you work. What tests do I need? Screening Your health care provider may recommend screening tests for certain conditions. This may include: Lipid and cholesterol levels. Diabetes screening. This is done by checking your blood sugar (glucose) after you have not eaten for a while (fasting). Hepatitis C test. Hepatitis B test. HIV (human  immunodeficiency virus) test. STI (sexually transmitted infection) testing, if you are at risk. Lung cancer screening. Colorectal cancer screening. Prostate cancer screening. Abdominal aortic aneurysm (AAA) screening. You may need this if you are a current or former smoker. Talk with your health care provider about your test results, treatment options, and if necessary, the need for more tests. Follow these instructions at home: Eating and drinking  Eat a diet that includes fresh fruits and vegetables, whole grains, lean protein, and low-fat dairy products. Limit your intake of foods with high amounts of sugar, saturated fats, and salt. Take vitamin and mineral supplements as recommended by your health care provider. Do not drink alcohol if your health care provider tells you not to drink. If you drink alcohol: Limit how much you have to 0-2 drinks a day. Know how much alcohol is in your drink. In the [place] one drink equals one 12 oz bottle of beer (355 mL), one 5 oz glass of wine (148 mL), or one 1 oz glass of hard liquor (44 mL). Lifestyle Brush your teeth every morning and night with fluoride toothpaste. Floss one time each day. Exercise for at least 30 minutes 5 or more days each week. Do not use any products that contain nicotine or tobacco. These products include cigarettes, chewing tobacco, and vaping devices, such as e-cigarettes. If you need help quitting, ask your health care provider. Do not use drugs. If you are sexually active, practice safe sex. Use a condom or other form of protection to prevent STIs. Take aspirin only as told by your health care provider. Make sure that you understand how much to take and what form to take. Work with your health care provider to find out whether it is safe   and beneficial for you to take aspirin daily. Ask your health care provider if you need to take a cholesterol-lowering medicine (statin). Find healthy ways to manage stress, such  as: Meditation, yoga, or listening to music. Journaling. Talking to a trusted person. Spending time with friends and family. Safety Always wear your seat belt while driving or riding in a vehicle. Do not drive: If you have been drinking alcohol. Do not ride with someone who has been drinking. When you are tired or distracted. While texting. If you have been using any mind-altering substances or drugs. Wear a helmet and other protective equipment during sports activities. If you have firearms in your house, make sure you follow all gun safety procedures. Minimize exposure to UV radiation to reduce your risk of skin cancer. What's next? Visit your health care provider once a year for an annual wellness visit. Ask your health care provider how often you should have your eyes and teeth checked. Stay up to date on all vaccines. This information is not intended to replace advice given to you by your health care provider. Make sure you discuss any questions you have with your health care provider. Document Revised: 11/29/2020 Document Reviewed: 11/29/2020 Elsevier Patient Education  2023 Elsevier Inc.  

## 2022-11-07 NOTE — Assessment & Plan Note (Signed)
Podiatry referral placed for foot pain and onychomycosis/nail care.

## 2022-11-07 NOTE — Assessment & Plan Note (Signed)
Well adult Orders Placed This Encounter  Procedures   COMPLETE METABOLIC PANEL WITH GFR   Lipid Panel w/reflex Direct LDL   CBC with Differential   Ambulatory referral to Podiatry    Referral Priority:   Routine    Referral Type:   Consultation    Referral Reason:   Specialty Services Required    Requested Specialty:   Podiatry    Number of Visits Requested:   1  Screenings: Per lab orders Immunizations: UTD Anticipatory guidance/Risk factor reduction:  recommendations per AVS.

## 2022-11-07 NOTE — Progress Notes (Signed)
Elma Wydra - 72 y.o. male MRN [id number]  Date of birth: 11/04/1950  Subjective Chief Complaint  Patient presents with   Annual Exam    HPI Bertus Bibeau is a 72 y.o. male here today for annual exam.   He reports that he is doing well.  He is having some pain on the ball of the L foot.  Also some issues with onychomycosis.   He is moderately active.  He has not seen neurology in a while. Has an appt with movement d/o clinic at [facility] in July.  He feels that his diet is pretty good.   He is a non-smoker. Occasional EtOH use.   Review of Systems  Constitutional:  Negative for chills, fever, malaise/fatigue and weight loss.  HENT:  Negative for congestion, ear pain and sore throat.   Eyes:  Negative for blurred vision, double vision and pain.  Respiratory:  Negative for cough and shortness of breath.   Cardiovascular:  Negative for chest pain and palpitations.  Gastrointestinal:  Negative for abdominal pain, blood in stool, constipation, heartburn and nausea.  Genitourinary:  Negative for dysuria and urgency.  Musculoskeletal:  Negative for joint pain and myalgias.  Neurological:  Negative for dizziness and headaches.  Endo/Heme/Allergies:  Does not bruise/bleed easily.  Psychiatric/Behavioral:  Negative for depression. The patient is not nervous/anxious and does not have insomnia.       No Known Allergies  Past Medical History:  Diagnosis Date   Anxiety    Cancer [facility])    Prostate 2022   Depression    Neuromuscular disorder (HCC)    Parkinson's 2021   Varicose veins of legs     History reviewed. No pertinent surgical history.  Social History   Socioeconomic History   Marital status: Married    Spouse name: June   Number of children: 3   Years of education: 16   Highest education level: Manufacturing engineer (e.g., MA, MS, MEng, MEd, MSW, MBA)  Occupational History   Occupation: Retired  Tobacco Use   Smoking status: Never   Smokeless tobacco: Never  Vaping Use    Vaping Use: Never used  Substance and Sexual Activity   Alcohol use: Yes    Alcohol/week: 2.0 standard drinks of alcohol    Types: 1 Glasses of wine, 1 Cans of beer per week    Comment: Drink occasionally.   Drug use: Never   Sexual activity: Not Currently    Partners: Female    Birth control/protection: None  Other Topics Concern   Not on file  Social History Narrative   Lives with his wife and daughter (she has down syndrome). He enjoys being outdoors.    Social Determinants of Health   Financial Resource Strain: Low Risk  (10/12/2022)   Overall Financial Resource Strain (CARDIA)    Difficulty of Paying Living Expenses: Not hard at all  Food Insecurity: No Food Insecurity (10/12/2022)   Hunger Vital Sign    Worried About Running Out of Food in the Last Year: Never true    Ran Out of Food in the Last Year: Never true  Transportation Needs: No Transportation Needs (10/12/2022)   PRAPARE - Administrator, Civil Service (Medical): No    Lack of Transportation (Non-Medical): No  Physical Activity: Sufficiently Active (10/12/2022)   Exercise Vital Sign    Days of Exercise per Week: 5 days    Minutes of Exercise per Session: 30 min  Stress: Stress Concern Present (10/12/2022)   [place]  Institute of Occupational Health - Occupational Stress Questionnaire    Feeling of Stress : To some extent  Social Connections: Socially Integrated (10/12/2022)   Social Connection and Isolation Panel [NHANES]    Frequency of Communication with Friends and Family: More than three times a week    Frequency of Social Gatherings with Friends and Family: Twice a week    Attends Religious Services: More than 4 times per year    Active Member of [employer] or Organizations: Yes    Attends Engineer, structural: More than 4 times per year    Marital Status: Married    Family History  Problem Relation Age of Onset   Pancreatic cancer Mother    Hypertension Mother    Diabetes Mother     Cancer Mother    Hypertension Father    Alcohol abuse Father    Arthritis Father    Varicose Veins Father    Skin cancer Sister    Hypertension Brother    Cancer Maternal Grandmother    ADD / ADHD Son    ADD / ADHD Son    Learning disabilities Son    Birth defects Daughter    Intellectual disability Daughter    Learning disabilities Daughter    Obesity Daughter    Hypertension Brother     Health Maintenance  Topic Date Due   Zoster Vaccines- Shingrix (1 of 2) 12/03/2022 (Originally 08/31/1969)   COVID-19 Vaccine (3 - Moderna risk series) 04/02/2023 (Originally 03/09/2020)   Pneumonia Vaccine 42+ Years old (2 of 2 - PPSV23 or PCV20) 09/02/2023 (Originally 12/13/2020)   Hepatitis C Screening  09/02/2023 (Originally 08/31/1968)   Fecal DNA (Cologuard)  12/27/2022   INFLUENZA VACCINE  01/16/2023   Medicare Annual Wellness (AWV)  09/02/2023   DTaP/Tdap/Td (3 - Td or Tdap) 10/08/2032   HPV VACCINES  Aged Out     ----------------------------------------------------------------------------------------------------------------------------------------------------------------------------------------------------------------- Physical Exam BP 133/84 (BP Location: Left Arm, Patient Position: Sitting, Cuff Size: Normal)   Pulse 73   Ht 5\' 8"  (1.727 m)   Wt 176 lb (79.8 kg)   SpO2 100%   BMI 26.76 kg/m   Physical Exam Constitutional:      General: He is not in acute distress. HENT:     Head: Normocephalic and atraumatic.     Right Ear: Tympanic membrane and external ear normal.     Left Ear: Tympanic membrane and external ear normal.  Eyes:     General: No scleral icterus. Neck:     Thyroid: No thyromegaly.  Cardiovascular:     Rate and Rhythm: Normal rate and regular rhythm.     Heart sounds: Normal heart sounds.  Pulmonary:     Effort: Pulmonary effort is normal.     Breath sounds: Normal breath sounds.  Abdominal:     General: Bowel sounds are normal. There is no distension.      Palpations: Abdomen is soft.     Tenderness: There is no abdominal tenderness. There is no guarding.  Musculoskeletal:     Cervical back: Normal range of motion.     Comments: TTP in space between 4th and 5th digit of L foot.  No ulcerations noted.  Toenails are dystrophic .   Lymphadenopathy:     Cervical: No cervical adenopathy.  Skin:    General: Skin is warm and dry.     Findings: No rash.  Neurological:     Mental Status: He is alert and oriented to person, place, and time.  Cranial Nerves: No cranial nerve deficit.     Motor: No abnormal muscle tone.  Psychiatric:        Mood and Affect: Mood normal.        Behavior: Behavior normal.     ------------------------------------------------------------------------------------------------------------------------------------------------------------------------------------------------------------------- Assessment and Plan  Parkinsonism (HCC) Continue PT.  Follow up with neurology.    Well adult exam Well adult Orders Placed This Encounter  Procedures   COMPLETE METABOLIC PANEL WITH GFR   Lipid Panel w/reflex Direct LDL   CBC with Differential   Ambulatory referral to Podiatry    Referral Priority:   Routine    Referral Type:   Consultation    Referral Reason:   Specialty Services Required    Requested Specialty:   Podiatry    Number of Visits Requested:   1  Screenings: Per lab orders Immunizations: UTD Anticipatory guidance/Risk factor reduction:  recommendations per AVS.    Left foot pain Podiatry referral placed for foot pain and onychomycosis/nail care.    No orders of the defined types were placed in this encounter.   No follow-ups on file.    This visit occurred during the SARS-CoV-2 public health emergency.  Safety protocols were in place, including screening questions prior to the visit, additional usage of staff PPE, and extensive cleaning of exam room while observing appropriate contact time as  indicated for disinfecting solutions.

## 2022-11-08 ENCOUNTER — Encounter: Payer: Self-pay | Admitting: Rehabilitative and Restorative Service Providers"

## 2022-11-08 ENCOUNTER — Ambulatory Visit: Payer: Medicare HMO | Admitting: Rehabilitative and Restorative Service Providers"

## 2022-11-08 DIAGNOSIS — R2681 Unsteadiness on feet: Secondary | ICD-10-CM | POA: Diagnosis not present

## 2022-11-08 DIAGNOSIS — M6281 Muscle weakness (generalized): Secondary | ICD-10-CM | POA: Diagnosis not present

## 2022-11-08 DIAGNOSIS — R2689 Other abnormalities of gait and mobility: Secondary | ICD-10-CM

## 2022-11-08 DIAGNOSIS — G20C Parkinsonism, unspecified: Secondary | ICD-10-CM | POA: Diagnosis not present

## 2022-11-08 LAB — COMPLETE METABOLIC PANEL WITH GFR
AG Ratio: 1.9 (calc) (ref 1.0–2.5)
ALT: 21 U/L (ref 9–46)
AST: 25 U/L (ref 10–35)
Albumin: 4.2 g/dL (ref 3.6–5.1)
Alkaline phosphatase (APISO): 83 U/L (ref 35–144)
BUN: 14 mg/dL (ref 7–25)
CO2: 24 mmol/L (ref 20–32)
Calcium: 9.1 mg/dL (ref 8.6–10.3)
Chloride: 104 mmol/L (ref 98–110)
Creat: 0.72 mg/dL (ref 0.70–1.28)
Globulin: 2.2 g/dL (calc) (ref 1.9–3.7)
Glucose, Bld: 90 mg/dL (ref 65–99)
Potassium: 4.1 mmol/L (ref 3.5–5.3)
Sodium: 140 mmol/L (ref 135–146)
Total Bilirubin: 0.7 mg/dL (ref 0.2–1.2)
Total Protein: 6.4 g/dL (ref 6.1–8.1)
eGFR: 97 mL/min/{1.73_m2} (ref >=60)

## 2022-11-08 LAB — CBC WITH DIFFERENTIAL/PLATELET
Absolute Monocytes: 556 cells/uL (ref 200–950)
Basophils Absolute: 42 cells/uL (ref 0–200)
Eosinophils Relative: 1.2 %
HCT: 43.4 % (ref 38.5–50.0)
Hemoglobin: 14.3 g/dL (ref 13.2–17.1)
Lymphs Abs: 1062 cells/uL (ref 850–3900)
MCV: 88.4 fL (ref 80.0–100.0)
MPV: 9.8 fL (ref 7.5–12.5)
Neutro Abs: 6540 cells/uL (ref 1500–7800)
Neutrophils Relative %: 78.8 %
Platelets: 221 10*3/uL (ref 140–400)
Total Lymphocyte: 12.8 %
WBC: 8.3 10*3/uL (ref 3.8–10.8)

## 2022-11-08 LAB — LIPID PANEL W/REFLEX DIRECT LDL
Cholesterol: 160 mg/dL (ref <200)
HDL: 38 mg/dL — ABNORMAL LOW (ref >=40)
LDL Cholesterol (Calc): 103 mg/dL (calc) — ABNORMAL HIGH
Non-HDL Cholesterol (Calc): 122 mg/dL (calc) (ref <130)
Total CHOL/HDL Ratio: 4.2 (calc) (ref <5.0)
Triglycerides: 95 mg/dL (ref <150)

## 2022-11-08 NOTE — Therapy (Signed)
OUTPATIENT PHYSICAL THERAPY   Patient Name: Marc Reynolds MRN: [id number] DOB:June 16, 1951, 72 y.o., male Today's Date: 11/08/2022  PCP: Everrett Coombe, DO REFERRING PROVIDER: Everrett Coombe, DO  END OF SESSION:  PT End of Session - 11/08/22 0945     Visit Number 7    Number of Visits 16    Date for PT Re-Evaluation 12/17/22    Authorization Type aetna medicare    Progress Note Due on Visit 10    PT Start Time 0930    PT Stop Time 1015    PT Time Calculation (min) 45 min    Activity Tolerance Patient tolerated treatment well    Behavior During Therapy [facility] for tasks assessed/performed            Past Medical History:  Diagnosis Date   Anxiety    Cancer (HCC)    Prostate 2022   Depression    Neuromuscular disorder (HCC)    Parkinson's 2021   Varicose veins of legs    History reviewed. No pertinent surgical history. Patient Active Problem List   Diagnosis Date Noted   Left foot pain 11/07/2022   Visit for suture removal 10/15/2022   Primary osteoarthritis of left knee 08/30/2022   Malignant neoplasm of prostate (HCC) 12/04/2020   Well adult exam 12/14/2019   Resting tremor 12/14/2019   Parkinsonism 05/01/2018   Hypertriglyceridemia 09/11/2015   Leukocytosis 09/11/2015   Anxiety and depression 08/11/2015   BPH (benign prostatic hyperplasia) 08/11/2015    ONSET DATE: 10/15/22 REFERRING DIAG: Primary Parkinsonism, balance problem THERAPY DIAG:  Other abnormalities of gait and mobility  Unsteadiness on feet  Muscle weakness (generalized)  Rationale for Evaluation and Treatment: Rehabilitation  SUBJECTIVE:                                                                                                                                                                                             SUBJECTIVE STATEMENT: Patient's hand still hurts.  Pt accompanied by: self PERTINENT HISTORY: dx Parkinson's 2021, anxiety, depression.  PAIN:  Are you having pain? Yes:  NPRS scale: 2/10 Pain location: L hand-- sprained during fall Pain description: sore Aggravating factors: movement Relieving factors: rest PRECAUTIONS: Fall WEIGHT BEARING RESTRICTIONS: No FALLS: Has patient fallen in last 6 months? Yes. Number of falls 1 ; has near falls. LIVING ENVIRONMENT: Lives with: lives with their spouse Lives in: House/apartment Stairs: Yes: External: 1 and 3, depending on door to enter steps; none Has following equipment at home: None PLOF: Independent PATIENT GOALS: Improve balance, return to independent walking.  OBJECTIVE:  (Measures in this section from initial evaluation unless otherwise  noted) GAIT: Gait pattern: decreased arm swing- Right, decreased arm swing- Left, decreased step length- Right, decreased step length- Left, decreased stance time- Left, decreased stride length, decreased ankle dorsiflexion- Right, decreased ankle dorsiflexion- Left, knee flexed in stance- Right, knee flexed in stance- Left, and shuffling Distance walked: 200 ft Assistive device utilized: None Level of assistance: Complete Independence FUNCTIONAL TESTS:  5 times sit to stand: 13.45 seconds (eval)  8.09 sec (11/05/22) Timed up and go (TUG): 13.85 seconds (eval) 13.02 sec (11/05/22) TUG cognitive: 17.01 seconds (eval) 15.5 sec (11/05/22) MiniBESTest:  15/32  (<20/32 is fall risk) (eval) 18/32 (11/05/22) Berg=43/56 2M: 2.18 ft/sec   [facility] Adult PT Treatment:                                                DATE: 11/08/22 Neuromuscular re-ed: PWR! Exercises Prone Up x 5 reps Twist x 5 reps with cues demo Step x 5 reps Reach x 5 reps PWR! Exercises Sitting Up x 10 Twist x 5 Lateral rock and reach x 5 PWR! Exercises Standing Up x 5 reps  Lateral weight shifting using door frame for tactile/target cues PWR! Transitions Sit<>Stand with cues for finger flicking Posterior stepping with bow working on posterior weight shifting with demo cues Gait: Treadmill walking  0.8-1.0 mph with UE support with cues for longer stride x 2 minutes with one stop due to safety. Patient has tendency to lean through Ues on bar and then feet get posterior to COG.  Needs min A Gait in long hallway with verbal cues for PWR up and large arm swing-- this helped promote greater heel strike  [facility] Adult PT Treatment:                                                DATE: 11/05/22  Neuromuscular re-ed: PWR! Up sit to stand, PWR! Twist for warm up Restest 5x STS = 8.49 sec Retest miniBest = 18/32 Standing balance on foam with head turns all directions Standing balance on foam narrow BOS - min A Standing physioball catch and reach for trunk extension and forward reaching Standing PWR! Step Standing trunk diagonals with focus on improving trunk mobility and wt shifts  PATIENT EDUCATION: Education details: HEP Person educated: Patient Education method: handout, demo, return demo Education comprehension: verbalized understanding, returned demonstration, and needs further education  HOME EXERCISE PROGRAM: Access Code: [email or web address] URL: [email or web address] Date: 10/18/2022 Prepared by: Margretta Ditty  Exercises - Sit to Stand with Arm Swing  - 2 x daily - 7 x weekly - 1 sets - 10 reps PWR! Exercise in sitting power up PWR! Step and PWR! Twist added Access Code: [email or web address] URL: [email or web address] Date: 11/05/2022 Prepared by: Reggy Eye  Exercises - Corner Balance Feet Apart: Eyes Open With Head Turns  - 1 x daily - 7 x weekly - 3 sets - 10 reps  GOALS: Goals reviewed with patient? Yes  SHORT TERM GOALS: Target date: 11/15/22  The patient will be indep with initial HEP. Baseline:Initiated at eval. Goal status: INITIAL  2.  The patient will improve 5 time sit<>stand to < or equal to 10 seconds. Baseline: 13.45 seconds Goal status: MET  3.  The patient will improve gait mechanics demonstrating  arm swing with gait in clinic. Baseline:  No arm swing with gait. Goal status: PARTIALLY MET-- patient needs verbal cues, but can demo arm swing in clinic.  4. The patient will report hand pain 0/10.  Baseline: 4/10 at eval  Goal status: PARTIALLY  MET--2/10 on 11/08/22  LONG TERM GOALS: Target date: 12/13/22  The patient will be indep with progression HEP. Baseline: Initiated at eval Goal status: INITIAL  2.  The patient will improve Berg score to > or equal to 48/56 to demo dec'd risk for falls. Baseline: 43/56 Goal status: INITIAL  3.  The patient will improve gait speed to > or equal to 2.8 ft/sec. Baseline:  2.18 ft/sec Goal status: INITIAL  4.  Improve Mini BEST test to > or equal to 20/32. Baseline: 15/32. Goal status: INITIAL  5.  The patient will move floor<>stand mod indep. Baseline:  Notes difficulty. Goal status: INITIAL  6.  The patient will demo outdoor walking mod indep x 600 ft. Baseline: Notes not walking alone due to recent fall. Goal status: INITIAL  ASSESSMENT:  CLINICAL IMPRESSION: Patient has met or partially met 3 STGs.  He tolerated activities well today. Treadmill leads to further anterior lean of trunk so did not get the desired outcome, Patient responds better to gait with cues on stop, PWR! Up, large arm swing and go.  Plan to progress to remaining STGs and LTGs.   OBJECTIVE IMPAIRMENTS: Abnormal gait, decreased activity tolerance, decreased balance, decreased coordination, impaired flexibility, impaired tone, postural dysfunction, and pain.   PLAN:  PT FREQUENCY: 2x/week  PT DURATION: 8 weeks  PLANNED INTERVENTIONS: Therapeutic exercises, Therapeutic activity, Neuromuscular re-education, Balance training, Gait training, Patient/Family education, Self Care, Joint mobilization, Taping, and Manual therapy  PLAN FOR NEXT SESSION:check HEP and progress to standing near support surface if able (post it notes on cabinets for rock/reach near support surface), check STG # 1; progress  posterior weight shifting and gait activities.   Jacinta Penalver, PT 11/08/2022, 9:46 AM

## 2022-11-12 ENCOUNTER — Ambulatory Visit: Payer: Medicare HMO | Admitting: Physical Therapy

## 2022-11-12 ENCOUNTER — Encounter: Payer: Self-pay | Admitting: Physical Therapy

## 2022-11-12 DIAGNOSIS — R2689 Other abnormalities of gait and mobility: Secondary | ICD-10-CM

## 2022-11-12 DIAGNOSIS — M6281 Muscle weakness (generalized): Secondary | ICD-10-CM | POA: Diagnosis not present

## 2022-11-12 DIAGNOSIS — R2681 Unsteadiness on feet: Secondary | ICD-10-CM

## 2022-11-12 DIAGNOSIS — G20C Parkinsonism, unspecified: Secondary | ICD-10-CM | POA: Diagnosis not present

## 2022-11-12 NOTE — Therapy (Signed)
OUTPATIENT PHYSICAL THERAPY   Patient Name: Marc Reynolds MRN: [id number] DOB:December 31, 1950, 72 y.o., male Today's Date: 11/12/2022  PCP: Everrett Coombe, DO REFERRING PROVIDER: Everrett Coombe, DO  END OF SESSION:  PT End of Session - 11/12/22 0925     Visit Number 8    Number of Visits 16    Date for PT Re-Evaluation 12/17/22    Authorization Type aetna medicare    Authorization - Visit Number 8    Progress Note Due on Visit 10    PT Start Time 0848    PT Stop Time 0926    PT Time Calculation (min) 38 min    Activity Tolerance Patient tolerated treatment well    Behavior During Therapy [facility] for tasks assessed/performed             Past Medical History:  Diagnosis Date   Anxiety    Cancer (HCC)    Prostate 2022   Depression    Neuromuscular disorder (HCC)    Parkinson's 2021   Varicose veins of legs    History reviewed. No pertinent surgical history. Patient Active Problem List   Diagnosis Date Noted   Left foot pain 11/07/2022   Visit for suture removal 10/15/2022   Primary osteoarthritis of left knee 08/30/2022   Malignant neoplasm of prostate (HCC) 12/04/2020   Well adult exam 12/14/2019   Resting tremor 12/14/2019   Parkinsonism 05/01/2018   Hypertriglyceridemia 09/11/2015   Leukocytosis 09/11/2015   Anxiety and depression 08/11/2015   BPH (benign prostatic hyperplasia) 08/11/2015    ONSET DATE: 10/15/22 REFERRING DIAG: Primary Parkinsonism, balance problem THERAPY DIAG:  Other abnormalities of gait and mobility  Unsteadiness on feet  Muscle weakness (generalized)  Rationale for Evaluation and Treatment: Rehabilitation  SUBJECTIVE:                                                                                                                                                                                             SUBJECTIVE STATEMENT: Patient's his hand only hurts when he picks things up. He states at his physical he was an inch taller than last year  due to improved posture Pt accompanied by: self PERTINENT HISTORY: dx Parkinson's 2021, anxiety, depression.  PAIN:  Are you having pain? Yes: NPRS scale: 1/10 Pain location: L hand-- sprained during fall Pain description: sore Aggravating factors: movement Relieving factors: rest PRECAUTIONS: Fall WEIGHT BEARING RESTRICTIONS: No FALLS: Has patient fallen in last 6 months? Yes. Number of falls 1 ; has near falls. LIVING ENVIRONMENT: Lives with: lives with their spouse Lives in: House/apartment Stairs: Yes: External: 1 and 3, depending on door to  enter steps; none Has following equipment at home: None PLOF: Independent PATIENT GOALS: Improve balance, return to independent walking.  OBJECTIVE:  (Measures in this section from initial evaluation unless otherwise noted) GAIT: Gait pattern: decreased arm swing- Right, decreased arm swing- Left, decreased step length- Right, decreased step length- Left, decreased stance time- Left, decreased stride length, decreased ankle dorsiflexion- Right, decreased ankle dorsiflexion- Left, knee flexed in stance- Right, knee flexed in stance- Left, and shuffling Distance walked: 200 ft Assistive device utilized: None Level of assistance: Complete Independence FUNCTIONAL TESTS:  5 times sit to stand: 13.45 seconds (eval)  8.09 sec (11/05/22) Timed up and go (TUG): 13.85 seconds (eval) 13.02 sec (11/05/22) TUG cognitive: 17.01 seconds (eval) 15.5 sec (11/05/22) MiniBESTest:  15/32  (<20/32 is fall risk) (eval) 18/32 (11/05/22) Berg=43/56 91M: 2.18 ft/sec   [facility] Adult PT Treatment:                                                DATE: 11/12/22  Neuromuscular re-ed: PWR! Up, reach and twist (with cues) in prone x 5 each Standing PWR! Up x 10 Gait training in hallway focus on reducing forward lean and improving heel strike Stepping forward/backward with focus on changing directions and wt shifts Standing on foam head turns laterally and vertically each 3  x 30 sec Standing PWR! Combo up, twist, reach x 10   [facility] Adult PT Treatment:                                                DATE: 11/08/22 Neuromuscular re-ed: PWR! Exercises Prone Up x 5 reps Twist x 5 reps with cues demo Step x 5 reps Reach x 5 reps PWR! Exercises Sitting Up x 10 Twist x 5 Lateral rock and reach x 5 PWR! Exercises Standing Up x 5 reps  Lateral weight shifting using door frame for tactile/target cues PWR! Transitions Sit<>Stand with cues for finger flicking Posterior stepping with bow working on posterior weight shifting with demo cues Gait: Treadmill walking 0.8-1.0 mph with UE support with cues for longer stride x 2 minutes with one stop due to safety. Patient has tendency to lean through Ues on bar and then feet get posterior to COG.  Needs min A Gait in long hallway with verbal cues for PWR up and large arm swing-- this helped promote greater heel strike    PATIENT EDUCATION: Education details: HEP Person educated: Patient Education method: handout, demo, return demo Education comprehension: verbalized understanding, returned demonstration, and needs further education  HOME EXERCISE PROGRAM: Access Code: [email or web address] URL: [email or web address] Date: 10/18/2022 Prepared by: Margretta Ditty  Exercises - Sit to Stand with Arm Swing  - 2 x daily - 7 x weekly - 1 sets - 10 reps PWR! Exercise in sitting power up PWR! Step and PWR! Twist added Access Code: [email or web address] URL: [email or web address] Date: 11/05/2022 Prepared by: Reggy Eye  Exercises - Corner Balance Feet Apart: Eyes Open With Head Turns  - 1 x daily - 7 x weekly - 3 sets - 10 reps  GOALS: Goals reviewed with patient? Yes  SHORT TERM GOALS: Target date: 11/15/22  The patient will be indep with initial HEP. Baseline:Initiated at eval. Goal status: MET  2.  The patient will improve 5 time sit<>stand to < or equal to 10 seconds. Baseline: 13.45  seconds Goal status: MET  3.  The patient will improve gait mechanics demonstrating arm swing with gait in clinic. Baseline: No arm swing with gait. Goal status: PARTIALLY MET-- patient needs verbal cues, but can demo arm swing in clinic.  4. The patient will report hand pain 0/10.  Baseline: 4/10 at eval  Goal status: PARTIALLY  MET--2/10 on 11/08/22  LONG TERM GOALS: Target date: 12/13/22  The patient will be indep with progression HEP. Baseline: Initiated at eval Goal status: INITIAL  2.  The patient will improve Berg score to > or equal to 48/56 to demo dec'd risk for falls. Baseline: 43/56 Goal status: INITIAL  3.  The patient will improve gait speed to > or equal to 2.8 ft/sec. Baseline:  2.18 ft/sec Goal status: INITIAL  4.  Improve Mini BEST test to > or equal to 20/32. Baseline: 15/32. Goal status: INITIAL  5.  The patient will move floor<>stand mod indep. Baseline:  Notes difficulty. Goal status: INITIAL  6.  The patient will demo outdoor walking mod indep x 600 ft. Baseline: Notes not walking alone due to recent fall. Goal status: INITIAL  ASSESSMENT:  CLINICAL IMPRESSION: Pt does well with cuing and with initial movements, tends to decrease amplitude with repetition and fatigue. He has noticeably improved posture during all activites  OBJECTIVE IMPAIRMENTS: Abnormal gait, decreased activity tolerance, decreased balance, decreased coordination, impaired flexibility, impaired tone, postural dysfunction, and pain.   PLAN:  PT FREQUENCY: 2x/week  PT DURATION: 8 weeks  PLANNED INTERVENTIONS: Therapeutic exercises, Therapeutic activity, Neuromuscular re-education, Balance training, Gait training, Patient/Family education, Self Care, Joint mobilization, Taping, and Manual therapy  PLAN FOR NEXT SESSION:check HEP and progress to standing near support surface if able (post it notes on cabinets for rock/reach near support surface),  progress posterior weight  shifting and gait activities.   Charlena Haub, PT 11/12/2022, 9:26 AM

## 2022-11-15 ENCOUNTER — Encounter: Payer: Self-pay | Admitting: Physical Therapy

## 2022-11-15 ENCOUNTER — Ambulatory Visit (INDEPENDENT_AMBULATORY_CARE_PROVIDER_SITE_OTHER): Payer: Medicare HMO

## 2022-11-15 ENCOUNTER — Ambulatory Visit: Payer: Medicare HMO | Admitting: Physical Therapy

## 2022-11-15 ENCOUNTER — Ambulatory Visit (INDEPENDENT_AMBULATORY_CARE_PROVIDER_SITE_OTHER): Payer: Medicare HMO | Admitting: Podiatry

## 2022-11-15 DIAGNOSIS — Z91199 Patient's noncompliance with other medical treatment and regimen due to unspecified reason: Secondary | ICD-10-CM | POA: Diagnosis not present

## 2022-11-15 DIAGNOSIS — M19072 Primary osteoarthritis, left ankle and foot: Secondary | ICD-10-CM | POA: Diagnosis not present

## 2022-11-15 DIAGNOSIS — R2689 Other abnormalities of gait and mobility: Secondary | ICD-10-CM

## 2022-11-15 DIAGNOSIS — M79672 Pain in left foot: Secondary | ICD-10-CM | POA: Diagnosis not present

## 2022-11-15 DIAGNOSIS — G20C Parkinsonism, unspecified: Secondary | ICD-10-CM | POA: Diagnosis not present

## 2022-11-15 DIAGNOSIS — M6281 Muscle weakness (generalized): Secondary | ICD-10-CM | POA: Diagnosis not present

## 2022-11-15 DIAGNOSIS — R2681 Unsteadiness on feet: Secondary | ICD-10-CM

## 2022-11-15 NOTE — Therapy (Signed)
OUTPATIENT PHYSICAL THERAPY   Patient Name: Marc Reynolds MRN: [id number] DOB:Nov 09, 1950, 72 y.o., male Today's Date: 11/15/2022  PCP: Everrett Coombe, DO REFERRING PROVIDER: Everrett Coombe, DO  END OF SESSION:  PT End of Session - 11/15/22 1014     Visit Number 9    Number of Visits 16    Date for PT Re-Evaluation 12/17/22    Authorization Type aetna medicare    Authorization - Visit Number 9    Progress Note Due on Visit 10    PT Start Time 0934    PT Stop Time 1014    PT Time Calculation (min) 40 min    Activity Tolerance Patient tolerated treatment well    Behavior During Therapy [facility] for tasks assessed/performed              Past Medical History:  Diagnosis Date   Anxiety    Cancer (HCC)    Prostate 2022   Depression    Neuromuscular disorder (HCC)    Parkinson's 2021   Varicose veins of legs    History reviewed. No pertinent surgical history. Patient Active Problem List   Diagnosis Date Noted   Left foot pain 11/07/2022   Visit for suture removal 10/15/2022   Primary osteoarthritis of left knee 08/30/2022   Malignant neoplasm of prostate (HCC) 12/04/2020   Well adult exam 12/14/2019   Resting tremor 12/14/2019   Parkinsonism 05/01/2018   Hypertriglyceridemia 09/11/2015   Leukocytosis 09/11/2015   Anxiety and depression 08/11/2015   BPH (benign prostatic hyperplasia) 08/11/2015    ONSET DATE: 10/15/22 REFERRING DIAG: Primary Parkinsonism, balance problem THERAPY DIAG:  Other abnormalities of gait and mobility  Unsteadiness on feet  Muscle weakness (generalized)  Rationale for Evaluation and Treatment: Rehabilitation  SUBJECTIVE:                                                                                                                                                                                             SUBJECTIVE STATEMENT: Patient states he feels wobbly with turning Pt accompanied by: self PERTINENT HISTORY: dx Parkinson's 2021,  anxiety, depression.  PAIN:  Are you having pain? Yes: NPRS scale: 1/10 Pain location: L hand-- sprained during fall Pain description: sore Aggravating factors: movement Relieving factors: rest PRECAUTIONS: Fall WEIGHT BEARING RESTRICTIONS: No FALLS: Has patient fallen in last 6 months? Yes. Number of falls 1 ; has near falls. LIVING ENVIRONMENT: Lives with: lives with their spouse Lives in: House/apartment Stairs: Yes: External: 1 and 3, depending on door to enter steps; none Has following equipment at home: None PLOF: Independent PATIENT GOALS: Improve balance, return to independent walking.  OBJECTIVE:  (Measures in this section from initial evaluation unless otherwise noted) GAIT: Gait pattern: decreased arm swing- Right, decreased arm swing- Left, decreased step length- Right, decreased step length- Left, decreased stance time- Left, decreased stride length, decreased ankle dorsiflexion- Right, decreased ankle dorsiflexion- Left, knee flexed in stance- Right, knee flexed in stance- Left, and shuffling Distance walked: 200 ft Assistive device utilized: None Level of assistance: Complete Independence FUNCTIONAL TESTS:  5 times sit to stand: 13.45 seconds (eval)  8.09 sec (11/05/22) Timed up and go (TUG): 13.85 seconds (eval) 13.02 sec (11/05/22) TUG cognitive: 17.01 seconds (eval) 15.5 sec (11/05/22) MiniBESTest:  15/32  (<20/32 is fall risk) (eval) 18/32 (11/05/22) Berg=43/56 63M: 2.18 ft/sec   [facility] Adult PT Treatment:                                                DATE: 11/15/22  Neuromuscular re-ed: Sit <> stand with step x 10 PWR! Step x 10 PWR! Step with turn for box step both directions CGA Gait with focus on turns around cones, pt with increased forward LOB decreased ability to regulate speed today Forward and backward stepping with focus on direction changes and getting weight on heels vs toes Gait in hallway with focus on controlling speed and increasing step and  stride length, pt requires min A to stop and slow down this session Prone PWR! Up x 10 with cervical extension   [facility] Adult PT Treatment:                                                DATE: 11/12/22  Neuromuscular re-ed: PWR! Up, reach and twist (with cues) in prone x 5 each Standing PWR! Up x 10 Gait training in hallway focus on reducing forward lean and improving heel strike Stepping forward/backward with focus on changing directions and wt shifts Standing on foam head turns laterally and vertically each 3 x 30 sec Standing PWR! Combo up, twist, reach x 10   [facility] Adult PT Treatment:                                                DATE: 11/08/22 Neuromuscular re-ed: PWR! Exercises Prone Up x 5 reps Twist x 5 reps with cues demo Step x 5 reps Reach x 5 reps PWR! Exercises Sitting Up x 10 Twist x 5 Lateral rock and reach x 5 PWR! Exercises Standing Up x 5 reps  Lateral weight shifting using door frame for tactile/target cues PWR! Transitions Sit<>Stand with cues for finger flicking Posterior stepping with bow working on posterior weight shifting with demo cues Gait: Treadmill walking 0.8-1.0 mph with UE support with cues for longer stride x 2 minutes with one stop due to safety. Patient has tendency to lean through Ues on bar and then feet get posterior to COG.  Needs min A Gait in long hallway with verbal cues for PWR up and large arm swing-- this helped promote greater heel strike    PATIENT EDUCATION: Education details: HEP Person educated: Patient Education method: handout, demo, return demo Education comprehension: verbalized understanding,  returned demonstration, and needs further education  HOME EXERCISE PROGRAM: Access Code: [email or web address] URL: [email or web address] Date: 10/18/2022 Prepared by: Margretta Ditty  Exercises - Sit to Stand with Arm Swing  - 2 x daily - 7 x weekly - 1 sets - 10 reps PWR! Exercise in sitting power up PWR! Step and PWR!  Twist added Access Code: [email or web address] URL: [email or web address] Date: 11/05/2022 Prepared by: Reggy Eye  Exercises - Corner Balance Feet Apart: Eyes Open With Head Turns  - 1 x daily - 7 x weekly - 3 sets - 10 reps  GOALS: Goals reviewed with patient? Yes  SHORT TERM GOALS: Target date: 11/15/22  The patient will be indep with initial HEP. Baseline:Initiated at eval. Goal status: MET  2.  The patient will improve 5 time sit<>stand to < or equal to 10 seconds. Baseline: 13.45 seconds Goal status: MET  3.  The patient will improve gait mechanics demonstrating arm swing with gait in clinic. Baseline: No arm swing with gait. Goal status: PARTIALLY MET-- patient needs verbal cues, but can demo arm swing in clinic.  4. The patient will report hand pain 0/10.  Baseline: 4/10 at eval  Goal status: PARTIALLY  MET--2/10 on 11/08/22  LONG TERM GOALS: Target date: 12/13/22  The patient will be indep with progression HEP. Baseline: Initiated at eval Goal status: INITIAL  2.  The patient will improve Berg score to > or equal to 48/56 to demo dec'd risk for falls. Baseline: 43/56 Goal status: INITIAL  3.  The patient will improve gait speed to > or equal to 2.8 ft/sec. Baseline:  2.18 ft/sec Goal status: INITIAL  4.  Improve Mini BEST test to > or equal to 20/32. Baseline: 15/32. Goal status: INITIAL  5.  The patient will move floor<>stand mod indep. Baseline:  Notes difficulty. Goal status: INITIAL  6.  The patient will demo outdoor walking mod indep x 600 ft. Baseline: Notes not walking alone due to recent fall. Goal status: INITIAL  ASSESSMENT:  CLINICAL IMPRESSION: Pt with more anterior LOB and more difficulty controlling  momentum during gait today. He does well with prone exercise to improve extension but with more LOB today  OBJECTIVE IMPAIRMENTS: Abnormal gait, decreased activity tolerance, decreased balance, decreased coordination, impaired  flexibility, impaired tone, postural dysfunction, and pain.   PLAN:  PT FREQUENCY: 2x/week  PT DURATION: 8 weeks  PLANNED INTERVENTIONS: Therapeutic exercises, Therapeutic activity, Neuromuscular re-education, Balance training, Gait training, Patient/Family education, Self Care, Joint mobilization, Taping, and Manual therapy  PLAN FOR NEXT SESSION: 10th visit note!!!check HEP and progress to standing near support surface if able (post it notes on cabinets for rock/reach near support surface),  progress posterior weight shifting and gait activities.   Tedrick Port, PT 11/15/2022, 10:15 AM

## 2022-11-18 ENCOUNTER — Ambulatory Visit
Admission: RE | Admit: 2022-11-18 | Discharge: 2022-11-18 | Disposition: A | Discharge status: Home / Self Care | Payer: Medicare HMO | Source: Ambulatory Visit | Attending: Urology | Admitting: Urology

## 2022-11-18 DIAGNOSIS — C61 Malignant neoplasm of prostate: Secondary | ICD-10-CM | POA: Diagnosis not present

## 2022-11-18 MED ORDER — GADOPICLENOL 0.5 MMOL/ML IV SOLN
9.0000 mL | Freq: Once | INTRAVENOUS | Status: AC | PRN
  Administered 2022-11-18: 9 mL via INTRAVENOUS

## 2022-11-18 NOTE — Progress Notes (Signed)
No show

## 2022-11-20 DIAGNOSIS — H524 Presbyopia: Secondary | ICD-10-CM | POA: Diagnosis not present

## 2022-11-20 DIAGNOSIS — H25813 Combined forms of age-related cataract, bilateral: Secondary | ICD-10-CM | POA: Diagnosis not present

## 2022-11-20 DIAGNOSIS — G629 Polyneuropathy, unspecified: Secondary | ICD-10-CM | POA: Diagnosis not present

## 2022-11-20 DIAGNOSIS — W19XXXD Unspecified fall, subsequent encounter: Secondary | ICD-10-CM | POA: Diagnosis not present

## 2022-11-20 DIAGNOSIS — Z1330 Encounter for screening examination for mental health and behavioral disorders, unspecified: Secondary | ICD-10-CM | POA: Diagnosis not present

## 2022-11-20 DIAGNOSIS — G20C Parkinsonism, unspecified: Secondary | ICD-10-CM | POA: Diagnosis not present

## 2022-11-21 ENCOUNTER — Encounter: Payer: Self-pay | Admitting: Podiatry

## 2022-11-21 ENCOUNTER — Ambulatory Visit: Payer: Medicare HMO | Admitting: Podiatry

## 2022-11-21 DIAGNOSIS — D2372 Other benign neoplasm of skin of left lower limb, including hip: Secondary | ICD-10-CM

## 2022-11-21 DIAGNOSIS — L603 Nail dystrophy: Secondary | ICD-10-CM | POA: Diagnosis not present

## 2022-11-21 DIAGNOSIS — B351 Tinea unguium: Secondary | ICD-10-CM | POA: Diagnosis not present

## 2022-11-21 NOTE — Progress Notes (Signed)
  Subjective:  Patient ID: Marc Reynolds, male    DOB: [birth year],   MRN: [id number]  Chief Complaint  Patient presents with   Foot Pain    Left foot pain    Nail Problem     Possible nail fungus    72 y.o. male presents for concern of discoloration and thickness of his nails that has been present for years. Denies any treatments. Does have a history of parkinsons and cancer. Also relates left foot pain. Relates an area on the bottom of his foot that is painful with walking and has tried to use corn pads on with no relief.  . Denies any other pedal complaints. Denies n/v/f/c.   Past Medical History:  Diagnosis Date   Anxiety    Cancer [facility])    Prostate 2022   Depression    Neuromuscular disorder (HCC)    Parkinson's 2021   Varicose veins of legs     Objective:  Physical Exam: Vascular: DP/PT pulses 2/4 bilateral. CFT <3 seconds. Normal hair growth on digits. No edema.  Skin. No lacerations or abrasions bilateral feet. Nails 1-5 bilateral are thickened elognated and discolored with subungual debris. Hyperkeratotic cored lesion with disruption of skin lines noted to plantar fifth metatarsal head on left.  Musculoskeletal: MMT 5/5 bilateral lower extremities in DF, PF, Inversion and Eversion. Deceased ROM in DF of ankle joint.  Neurological: Sensation intact to light touch.   Assessment:   1. Onychomycosis   2. Benign neoplasm of skin of left foot      Plan:  Patient was evaluated and treated and all questions answered. -Examined patient -Discussed treatment options for painful dystrophic nails  -Clinical picture and Fungal culture was obtained by removing a portion of the hard nail itself from each of the involved toenails using a sterile nail nipper and sent to [facility] lab. Patient tolerated the biopsy procedure well without discomfort or need for anesthesia.  -Discussed fungal nail treatment options including oral, topical, and laser treatments.  -Discussed lesions of the skin.  with patient and treatment options.  -Hyperkeratotic tissue was debrided with chisel without incident.  -Applied salycylic acid treatment to area with dressing. Advised to remove bandaging tomorrow.  -Encouraged daily moisturizing -Discussed use of pumice stone -Advised good supportive shoes and inserts -Patient to return in 4 weeks for follow up evaluation and discussion of fungal culture results or sooner if symptoms worsen.   Louann Sjogren, DPM

## 2022-11-27 ENCOUNTER — Ambulatory Visit: Payer: Medicare HMO | Attending: Family Medicine | Admitting: Rehabilitative and Restorative Service Providers"

## 2022-11-27 ENCOUNTER — Encounter: Payer: Self-pay | Admitting: Rehabilitative and Restorative Service Providers"

## 2022-11-27 DIAGNOSIS — R2681 Unsteadiness on feet: Secondary | ICD-10-CM | POA: Insufficient documentation

## 2022-11-27 DIAGNOSIS — M6281 Muscle weakness (generalized): Secondary | ICD-10-CM | POA: Diagnosis not present

## 2022-11-27 DIAGNOSIS — R2689 Other abnormalities of gait and mobility: Secondary | ICD-10-CM | POA: Insufficient documentation

## 2022-11-27 NOTE — Therapy (Signed)
OUTPATIENT PHYSICAL THERAPY and 10TH VISIT progress note  Patient Name: Marc Reynolds MRN: [id number] DOB:07/18/1950, 72 y.o., male Today's Date: 11/27/2022  PCP: Everrett Coombe, DO REFERRING PROVIDER: Everrett Coombe, DO  Physical Therapy Progress Note   Dates of Reporting Period:10/18/22 to 11/27/22   Objective Measurements:  See updated measures below  Reason Skilled Services are Required: Progressing mobility, functional balance, and gait.   Thank you for the referral of this patient.   END OF SESSION:  PT End of Session - 11/27/22 0840     Visit Number 10    Number of Visits 16    Date for PT Re-Evaluation 12/17/22    Authorization Type [employer]na medicare    Authorization - Visit Number 10    Progress Note Due on Visit 10    PT Start Time [phone]    PT Stop Time 0925    PT Time Calculation (min) 42 min    Activity Tolerance Patient tolerated treatment well    Behavior During Therapy [facility] for tasks assessed/performed            Past Medical History:  Diagnosis Date   Anxiety    Cancer (HCC)    Prostate 2022   Depression    Neuromuscular disorder (HCC)    Parkinson's 2021   Varicose veins of legs    History reviewed. No pertinent surgical history. Patient Active Problem List   Diagnosis Date Noted   Left foot pain 11/07/2022   Visit for suture removal 10/15/2022   Primary osteoarthritis of left knee 08/30/2022   Malignant neoplasm of prostate (HCC) 12/04/2020   Well adult exam 12/14/2019   Resting tremor 12/14/2019   Parkinsonism 05/01/2018   Hypertriglyceridemia 09/11/2015   Leukocytosis 09/11/2015   Anxiety and depression 08/11/2015   BPH (benign prostatic hyperplasia) 08/11/2015   ONSET DATE: 10/15/22 REFERRING DIAG: Primary Parkinsonism, balance problem THERAPY DIAG:  Other abnormalities of gait and mobility  Unsteadiness on feet  Muscle weakness (generalized)  Rationale for Evaluation and Treatment: Rehabilitation  SUBJECTIVE:                                                                                                                                                                                         SUBJECTIVE STATEMENT: Patient states he feels wobbly with turning Pt accompanied by: self PERTINENT HISTORY: dx Parkinson's 2021, anxiety, depression.  PAIN:  Are you having pain? Yes: NPRS scale: 1/10 Pain location: L hand-- sprained during fall Pain description: sore Aggravating factors: movement Relieving factors: rest PRECAUTIONS: Fall WEIGHT BEARING RESTRICTIONS: No FALLS: Has patient fallen in last 6 months? Yes. Number of falls 1 ; has near  falls. LIVING ENVIRONMENT: Lives with: lives with their spouse Lives in: House/apartment Stairs: Yes: External: 1 and 3, depending on door to enter steps; none Has following equipment at home: None PLOF: Independent PATIENT GOALS: Improve balance, return to independent walking.  OBJECTIVE:  (Measures in this section from initial evaluation unless otherwise noted) GAIT: Gait pattern: decreased arm swing- Right, decreased arm swing- Left, decreased step length- Right, decreased step length- Left, decreased stance time- Left, decreased stride length, decreased ankle dorsiflexion- Right, decreased ankle dorsiflexion- Left, knee flexed in stance- Right, knee flexed in stance- Left, and shuffling Distance walked: 200 ft Assistive device utilized: None Level of assistance: Complete Independence FUNCTIONAL TESTS:  5 times sit to stand: 13.45 seconds (eval)  8.09 sec (11/05/22) Timed up and go (TUG): 13.85 seconds (eval) 13.02 sec (11/05/22) TUG cognitive: 17.01 seconds (eval) 15.5 sec (11/05/22) MiniBESTest:  15/32  (<20/32 is fall risk) (eval) 18/32 (11/05/22) Berg=43/56 72M: 2.18 ft/sec   [facility] Adult PT Treatment:                                                DATE: 11/27/22 Therapeutic Exercise: Seated L hand finger flexion L hand finger ad/abduction L wrist flexion/extension Prayer  stretch Prone PWR! Up, PWR! [place], [place]! Twist, PWR! Transition x 5 reps Quadriped Rocking to hand tolerance x 5 reps Standing PWR! Up, PWR! [place], [place]! Twist, PWR! Transition x 5 reps   [facility] Adult PT Treatment:                                                DATE: 11/15/22  Neuromuscular re-ed: Sit <> stand with step x 10 PWR! Step x 10 PWR! Step with turn for box step both directions CGA Gait with focus on turns around cones, pt with increased forward LOB decreased ability to regulate speed today Forward and backward stepping with focus on direction changes and getting weight on heels vs toes Gait in hallway with focus on controlling speed and increasing step and stride length, pt requires min A to stop and slow down this session Prone PWR! Up x 10 with cervical extension   [facility] Adult PT Treatment:                                                DATE: 11/12/22  Neuromuscular re-ed: PWR! Up, reach and twist (with cues) in prone x 5 each Standing PWR! Up x 10 Gait training in hallway focus on reducing forward lean and improving heel strike Stepping forward/backward with focus on changing directions and wt shifts Standing on foam head turns laterally and vertically each 3 x 30 sec Standing PWR! Combo up, twist, reach x 10   [facility] Adult PT Treatment:                                                DATE: 11/08/22 Neuromuscular re-ed: PWR! Exercises Prone Up x 5 reps Twist x 5 reps with  cues demo Step x 5 reps Reach x 5 reps PWR! Exercises Sitting Up x 10 Twist x 5 Lateral rock and reach x 5 PWR! Exercises Standing Up x 5 reps  Lateral weight shifting using door frame for tactile/target cues PWR! Transitions Sit<>Stand with cues for finger flicking Posterior stepping with bow working on posterior weight shifting with demo cues Gait: Treadmill walking 0.8-1.0 mph with UE support with cues for longer stride x 2 minutes with one stop due to safety. Patient has tendency to lean  through [place] on bar and then feet get posterior to COG.  Needs min A Gait in long hallway with verbal cues for PWR up and large arm swing-- this helped promote greater heel strike  PATIENT EDUCATION: Education details: HEP Person educated: Patient Education method: handout, demo, return demo Education comprehension: verbalized understanding, returned demonstration, and needs further education  HOME EXERCISE PROGRAM: Access Code: [email or web address] URL: [email or web address] Date: 10/18/2022 Prepared by: Margretta Ditty  Exercises - Sit to Stand with Arm Swing  - 2 x daily - 7 x weekly - 1 sets - 10 reps PWR! Exercise in sitting power up PWR! Step and PWR! Twist added Access Code: [email or web address] URL: [email or web address] Date: 11/05/2022 Prepared by: Reggy Eye  Exercises - Corner Balance Feet Apart: Eyes Open With Head Turns  - 1 x daily - 7 x weekly - 3 sets - 10 reps  GOALS: Goals reviewed with patient? Yes  SHORT TERM GOALS: Target date: 11/15/22  The patient will be indep with initial HEP. Baseline:Initiated at eval. Goal status: MET  2.  The patient will improve 5 time sit<>stand to < or equal to 10 seconds. Baseline: 13.45 seconds Goal status: MET  3.  The patient will improve gait mechanics demonstrating arm swing with gait in clinic. Baseline: No arm swing with gait. Goal status: PARTIALLY MET-- patient needs verbal cues, but can demo arm swing in clinic.  4. The patient will report hand pain 0/10.  Baseline: 4/10 at eval  Goal status: PARTIALLY  MET--2/10 on 11/08/22  LONG TERM GOALS: Target date: 12/13/22  The patient will be indep with progression HEP. Baseline: Initiated at eval Goal status: INITIAL  2.  The patient will improve Berg score to > or equal to 48/56 to demo dec'd risk for falls. Baseline: 43/56 Goal status: INITIAL  3.  The patient will improve gait speed to > or equal to 2.8 ft/sec. Baseline:  2.18 ft/sec Goal  status: INITIAL  4.  Improve Mini BEST test to > or equal to 20/32. Baseline: 15/32. Goal status: INITIAL  5.  The patient will move floor<>stand mod indep. Baseline:  Notes difficulty. Goal status: INITIAL  6.  The patient will demo outdoor walking mod indep x 600 ft. Baseline: Notes not walking alone due to recent fall. Goal status: INITIAL  ASSESSMENT:  CLINICAL IMPRESSION: Patient is still limited by L hand pain. He has pain over carpals and notes tight sensation with stretching. He reports he had multiple x-rays with no fx. PT began gentle activities in sitting and then progressed to quadriped for loading to tolerance. PT to progress to LTGs.  OBJECTIVE IMPAIRMENTS: Abnormal gait, decreased activity tolerance, decreased balance, decreased coordination, impaired flexibility, impaired tone, postural dysfunction, and pain.   PLAN:  PT FREQUENCY: 2x/week  PT DURATION: 8 weeks  PLANNED INTERVENTIONS: Therapeutic exercises, Therapeutic activity, Neuromuscular re-education, Balance training, Gait training, Patient/Family education, Self Care, Joint mobilization, Taping, and Manual therapy  PLAN FOR NEXT SESSION: check HEP and  progress to standing near support surface if able (post it notes on cabinets for rock/reach near support surface),  progress posterior weight shifting and gait activities. Consider adding PWR! Standing video for home [email or web address]   Dashae Wilcher, PT 11/27/2022, 2:42 PM

## 2022-12-04 ENCOUNTER — Encounter: Payer: Self-pay | Admitting: Physical Therapy

## 2022-12-04 ENCOUNTER — Ambulatory Visit: Payer: Medicare HMO | Admitting: Physical Therapy

## 2022-12-04 DIAGNOSIS — Z85820 Personal history of malignant melanoma of skin: Secondary | ICD-10-CM | POA: Diagnosis not present

## 2022-12-04 DIAGNOSIS — R2689 Other abnormalities of gait and mobility: Secondary | ICD-10-CM | POA: Diagnosis not present

## 2022-12-04 DIAGNOSIS — R2681 Unsteadiness on feet: Secondary | ICD-10-CM

## 2022-12-04 DIAGNOSIS — D225 Melanocytic nevi of trunk: Secondary | ICD-10-CM | POA: Diagnosis not present

## 2022-12-04 DIAGNOSIS — M6281 Muscle weakness (generalized): Secondary | ICD-10-CM | POA: Diagnosis not present

## 2022-12-04 NOTE — Therapy (Signed)
OUTPATIENT PHYSICAL THERAPY  Patient Name: Marc Reynolds MRN: [id number] DOB:10/04/1950, 72 y.o., male Today's Date: 12/04/2022  PCP: Everrett Coombe, DO REFERRING PROVIDER: Everrett Coombe, DO   END OF SESSION:  PT End of Session - 12/04/22 1010     Visit Number 11    Number of Visits 16    Date for PT Re-Evaluation 12/17/22    Authorization - Visit Number 11    Progress Note Due on Visit 20    PT Start Time 0930    PT Stop Time 1011    PT Time Calculation (min) 41 min    Activity Tolerance Patient tolerated treatment well    Behavior During Therapy [facility] for tasks assessed/performed             Past Medical History:  Diagnosis Date   Anxiety    Cancer (HCC)    Prostate 2022   Depression    Neuromuscular disorder (HCC)    Parkinson's 2021   Varicose veins of legs    History reviewed. No pertinent surgical history. Patient Active Problem List   Diagnosis Date Noted   Left foot pain 11/07/2022   Visit for suture removal 10/15/2022   Primary osteoarthritis of left knee 08/30/2022   Malignant neoplasm of prostate (HCC) 12/04/2020   Well adult exam 12/14/2019   Resting tremor 12/14/2019   Parkinsonism 05/01/2018   Hypertriglyceridemia 09/11/2015   Leukocytosis 09/11/2015   Anxiety and depression 08/11/2015   BPH (benign prostatic hyperplasia) 08/11/2015   ONSET DATE: 10/15/22 REFERRING DIAG: Primary Parkinsonism, balance problem THERAPY DIAG:  Other abnormalities of gait and mobility  Unsteadiness on feet  Muscle weakness (generalized)  Rationale for Evaluation and Treatment: Rehabilitation  SUBJECTIVE:                                                                                                                                                                                        SUBJECTIVE STATEMENT: Pt states MD changed his medicine and he feels he is not shaking as much. He states his pinky finger still hurts  Pt accompanied by: self PERTINENT HISTORY: dx  Parkinson's 2021, anxiety, depression.  PAIN:  Are you having pain? Yes: NPRS scale: 1/10 Pain location: L hand-- sprained during fall Pain description: sore Aggravating factors: movement Relieving factors: rest PRECAUTIONS: Fall WEIGHT BEARING RESTRICTIONS: No FALLS: Has patient fallen in last 6 months? Yes. Number of falls 1 ; has near falls. LIVING ENVIRONMENT: Lives with: lives with their spouse Lives in: House/apartment Stairs: Yes: External: 1 and 3, depending on door to enter steps; none Has following equipment at home: None PLOF: Independent PATIENT GOALS: Improve balance, return to  independent walking.  OBJECTIVE:  (Measures in this section from initial evaluation unless otherwise noted) GAIT: Gait pattern: decreased arm swing- Right, decreased arm swing- Left, decreased step length- Right, decreased step length- Left, decreased stance time- Left, decreased stride length, decreased ankle dorsiflexion- Right, decreased ankle dorsiflexion- Left, knee flexed in stance- Right, knee flexed in stance- Left, and shuffling Distance walked: 200 ft Assistive device utilized: None Level of assistance: Complete Independence FUNCTIONAL TESTS:  5 times sit to stand: 13.45 seconds (eval)  8.09 sec (11/05/22) Timed up and go (TUG): 13.85 seconds (eval) 13.02 sec (11/05/22) TUG cognitive: 17.01 seconds (eval) 15.5 sec (11/05/22) MiniBESTest:  15/32  (<20/32 is fall risk) (eval) 18/32 (11/05/22) Berg=43/56 18M: 2.18 ft/sec   [facility] Adult PT Treatment:                                                DATE: 12/04/22  Neuromuscular re-ed: Standing PWR! Up, PWR! Twist, PWR! Step Ball catch and reach for focus on extension Prone PWR! Up, PWR! Step Quadruped rocking - attempt tall kneeling, pt requires assistance to maintain tall kneeling Gait training with focus on controlling momentum and speed Gait with direction changes Seated PWR! Up with ball Static balance tandem, EC, head turns all with  intermittent UE support   [facility] Adult PT Treatment:                                                DATE: 11/27/22 Therapeutic Exercise: Seated L hand finger flexion L hand finger ad/abduction L wrist flexion/extension Prayer stretch Prone PWR! Up, PWR! [place], [place]! Twist, PWR! Transition x 5 reps Quadriped Rocking to hand tolerance x 5 reps Standing PWR! Up, PWR! [place], [place]! Twist, PWR! Transition x 5 reps   [facility] Adult PT Treatment:                                                DATE: 11/15/22  Neuromuscular re-ed: Sit <> stand with step x 10 PWR! Step x 10 PWR! Step with turn for box step both directions CGA Gait with focus on turns around cones, pt with increased forward LOB decreased ability to regulate speed today Forward and backward stepping with focus on direction changes and getting weight on heels vs toes Gait in hallway with focus on controlling speed and increasing step and stride length, pt requires min A to stop and slow down this session Prone PWR! Up x 10 with cervical extension   PATIENT EDUCATION: Education details: HEP Person educated: Patient Education method: handout, demo, return demo Education comprehension: verbalized understanding, returned demonstration, and needs further education  HOME EXERCISE PROGRAM: Access Code: [email or web address] URL: [email or web address] Date: 10/18/2022 Prepared by: Margretta Ditty  Exercises - Sit to Stand with Arm Swing  - 2 x daily - 7 x weekly - 1 sets - 10 reps PWR! Exercise in sitting power up PWR! Step and PWR! Twist added Access Code: [email or web address] URL: [email or web address] Date: 11/05/2022 Prepared by: Reggy Eye  Exercises - Corner Balance Feet Apart: Eyes Open With Head Turns  - 1 x daily - 7  x weekly - 3 sets - 10 reps  GOALS: Goals reviewed with patient? Yes  SHORT TERM GOALS: Target date: 11/15/22  The patient will be indep with initial HEP. Baseline:Initiated at eval. Goal  status: MET  2.  The patient will improve 5 time sit<>stand to < or equal to 10 seconds. Baseline: 13.45 seconds Goal status: MET  3.  The patient will improve gait mechanics demonstrating arm swing with gait in clinic. Baseline: No arm swing with gait. Goal status: PARTIALLY MET-- patient needs verbal cues, but can demo arm swing in clinic.  4. The patient will report hand pain 0/10.  Baseline: 4/10 at eval  Goal status: PARTIALLY  MET--2/10 on 11/08/22  LONG TERM GOALS: Target date: 12/13/22  The patient will be indep with progression HEP. Baseline: Initiated at eval Goal status: INITIAL  2.  The patient will improve Berg score to > or equal to 48/56 to demo dec'd risk for falls. Baseline: 43/56 Goal status: INITIAL  3.  The patient will improve gait speed to > or equal to 2.8 ft/sec. Baseline:  2.18 ft/sec Goal status: INITIAL  4.  Improve Mini BEST test to > or equal to 20/32. Baseline: 15/32. Goal status: INITIAL  5.  The patient will move floor<>stand mod indep. Baseline:  Notes difficulty. Goal status: INITIAL  6.  The patient will demo outdoor walking mod indep x 600 ft. Baseline: Notes not walking alone due to recent fall. Goal status: INITIAL  ASSESSMENT:  CLINICAL IMPRESSION: Pt with improved motor control today during gait. Still with difficulty with full trunk extension especially in tall kneeling and when fatigued. Improved performance with static balance today  OBJECTIVE IMPAIRMENTS: Abnormal gait, decreased activity tolerance, decreased balance, decreased coordination, impaired flexibility, impaired tone, postural dysfunction, and pain.   PLAN:  PT FREQUENCY: 2x/week  PT DURATION: 8 weeks  PLANNED INTERVENTIONS: Therapeutic exercises, Therapeutic activity, Neuromuscular re-education, Balance training, Gait training, Patient/Family education, Self Care, Joint mobilization, Taping, and Manual therapy  PLAN FOR NEXT SESSION: check HEP and progress to  standing near support surface if able (post it notes on cabinets for rock/reach near support surface),  progress posterior weight shifting and gait activities. Consider adding PWR! Standing video for home [email or web address]   Azani Brogdon, PT 12/04/2022, 10:11 AM

## 2022-12-06 ENCOUNTER — Encounter: Payer: Self-pay | Admitting: Rehabilitative and Restorative Service Providers"

## 2022-12-06 ENCOUNTER — Ambulatory Visit: Payer: Medicare HMO | Admitting: Rehabilitative and Restorative Service Providers"

## 2022-12-06 ENCOUNTER — Encounter: Payer: Medicare HMO | Admitting: Physical Therapy

## 2022-12-06 DIAGNOSIS — R2689 Other abnormalities of gait and mobility: Secondary | ICD-10-CM | POA: Diagnosis not present

## 2022-12-06 DIAGNOSIS — M6281 Muscle weakness (generalized): Secondary | ICD-10-CM

## 2022-12-06 DIAGNOSIS — R2681 Unsteadiness on feet: Secondary | ICD-10-CM | POA: Diagnosis not present

## 2022-12-06 NOTE — Therapy (Signed)
OUTPATIENT PHYSICAL THERAPY  Patient Name: Marc Reynolds MRN: [id number] DOB:Apr 23, 1951, 72 y.o., male Today's Date: 12/06/2022  PCP: Everrett Coombe, DO REFERRING PROVIDER: Everrett Coombe, DO   END OF SESSION:  PT End of Session - 12/06/22 1105     Visit Number 12    Number of Visits 16    Date for PT Re-Evaluation 12/17/22    Authorization Type aetna medicare    Authorization - Visit Number 12    Progress Note Due on Visit 20    PT Start Time 1105    PT Stop Time 1145    PT Time Calculation (min) 40 min    Activity Tolerance Patient tolerated treatment well    Behavior During Therapy [facility] for tasks assessed/performed            Past Medical History:  Diagnosis Date   Anxiety    Cancer (HCC)    Prostate 2022   Depression    Neuromuscular disorder (HCC)    Parkinson's 2021   Varicose veins of legs    History reviewed. No pertinent surgical history. Patient Active Problem List   Diagnosis Date Noted   Left foot pain 11/07/2022   Visit for suture removal 10/15/2022   Primary osteoarthritis of left knee 08/30/2022   Malignant neoplasm of prostate (HCC) 12/04/2020   Well adult exam 12/14/2019   Resting tremor 12/14/2019   Parkinsonism 05/01/2018   Hypertriglyceridemia 09/11/2015   Leukocytosis 09/11/2015   Anxiety and depression 08/11/2015   BPH (benign prostatic hyperplasia) 08/11/2015   ONSET DATE: 10/15/22 REFERRING DIAG: Primary Parkinsonism, balance problem THERAPY DIAG:  Other abnormalities of gait and mobility  Unsteadiness on feet  Muscle weakness (generalized)  Rationale for Evaluation and Treatment: Rehabilitation  SUBJECTIVE:                                                                                                                                                                                        SUBJECTIVE STATEMENT: The patient fell last night and scraped his R forearm. He was walking indoors and fell when turning the corner. Pt  accompanied by: self PERTINENT HISTORY: dx Parkinson's 2021, anxiety, depression.  PAIN:  Are you having pain? Yes: NPRS scale: 1/10 Pain location: L hand-- sprained during fall Pain description: sore Aggravating factors: movement Relieving factors: rest PRECAUTIONS: Fall WEIGHT BEARING RESTRICTIONS: No FALLS: Has patient fallen in last 6 months? Yes. Number of falls 1 ; has near falls. LIVING ENVIRONMENT: Lives with: lives with their spouse Lives in: House/apartment Stairs: Yes: External: 1 and 3, depending on door to enter steps; none Has following equipment at home: None PLOF: Independent PATIENT GOALS: Improve  balance, return to independent walking.  OBJECTIVE:  (Measures in this section from initial evaluation unless otherwise noted) GAIT: Gait pattern: decreased arm swing- Right, decreased arm swing- Left, decreased step length- Right, decreased step length- Left, decreased stance time- Left, decreased stride length, decreased ankle dorsiflexion- Right, decreased ankle dorsiflexion- Left, knee flexed in stance- Right, knee flexed in stance- Left, and shuffling Distance walked: 200 ft Assistive device utilized: None Level of assistance: Complete Independence FUNCTIONAL TESTS:  5 times sit to stand: 13.45 seconds (eval)  8.09 sec (11/05/22) Timed up and go (TUG): 13.85 seconds (eval) 13.02 sec (11/05/22) TUG cognitive: 17.01 seconds (eval) 15.5 sec (11/05/22) MiniBESTest:  15/32  (<20/32 is fall risk) (eval) 18/32 (11/05/22) Berg=43/56 60M: 2.18 ft/sec   [facility] Adult PT Treatment:                                                DATE: 12/06/22 Neuromuscular re-ed: Prone PWR! Up -- painful in low back x 2 reps PWR! Twist with cues x 2 reps Standing With pool noodles for UE engagement standing PWR! Stepping to targets with hitting targets with pool noodles PWR! Twist with reaching with pool noodles Agility ladder adding stepping with turns, and wide to narrow  standing Quadriped PWR! Up to tall kneel x 5 reps PWR! Twist x 3 reps Gait: Emphasized arm swing by providing rotational cues to scapulae and then using dowels in Ues for arm swing emphasis X 200 ft in hallway in building performing cues for slower pace and taller posture Self Care: Discussed recommendations for Parkinson's exercise (PD foundation recommendations), video for seated PWR! Moves and discussed safety fo rhome   [facility] Adult PT Treatment:                                                DATE: 12/04/22  Neuromuscular re-ed: Standing PWR! Up, PWR! Twist, PWR! Step Ball catch and reach for focus on extension Prone PWR! Up, PWR! Step Quadruped rocking - attempt tall kneeling, pt requires assistance to maintain tall kneeling Gait training with focus on controlling momentum and speed Gait with direction changes Seated PWR! Up with ball Static balance tandem, EC, head turns all with intermittent UE support  PATIENT EDUCATION: Education details: HEP Person educated: Patient Education method: handout, demo, return demo Education comprehension: verbalized understanding, returned demonstration, and needs further education  HOME EXERCISE PROGRAM: Access Code: [email or web address] URL: [email or web address] Date: 10/18/2022 Prepared by: Margretta Ditty  Exercises - Sit to Stand with Arm Swing  - 2 x daily - 7 x weekly - 1 sets - 10 reps PWR! Exercise in sitting power up PWR! Step and PWR! Twist added Access Code: [email or web address] URL: [email or web address] Date: 11/05/2022 Prepared by: Reggy Eye  Exercises - Corner Balance Feet Apart: Eyes Open With Head Turns  - 1 x daily - 7 x weekly - 3 sets - 10 reps  GOALS: Goals reviewed with patient? Yes  SHORT TERM GOALS: Target date: 11/15/22  The patient will be indep with initial HEP. Baseline:Initiated at eval. Goal status: MET  2.  The patient will improve 5 time sit<>stand to < or equal to 10  seconds. Baseline: 13.45 seconds Goal status: MET  3.  The patient will  improve gait mechanics demonstrating arm swing with gait in clinic. Baseline: No arm swing with gait. Goal status: PARTIALLY MET-- patient needs verbal cues, but can demo arm swing in clinic.  4. The patient will report hand pain 0/10.  Baseline: 4/10 at eval  Goal status: PARTIALLY  MET--2/10 on 11/08/22  LONG TERM GOALS: Target date: 12/13/22  The patient will be indep with progression HEP. Baseline: Initiated at eval Goal status: INITIAL  2.  The patient will improve Berg score to > or equal to 48/56 to demo dec'd risk for falls. Baseline: 43/56 Goal status: INITIAL  3.  The patient will improve gait speed to > or equal to 2.8 ft/sec. Baseline:  2.18 ft/sec Goal status: INITIAL  4.  Improve Mini BEST test to > or equal to 20/32. Baseline: 15/32. Goal status: INITIAL  5.  The patient will move floor<>stand mod indep. Baseline:  Notes difficulty. Goal status: INITIAL  6.  The patient will demo outdoor walking mod indep x 600 ft. Baseline: Notes not walking alone due to recent fall. Goal status: INITIAL  ASSESSMENT:  CLINICAL IMPRESSION: Patient is continuing with falls. PT to reach out to neurologist to determine if any PWR! Exercise classes in this community (patient does not want to drive to [place]). Plan to check goals next week. May want to trial standing SRA lab PWR! Video to see if he could perform at home for post d/c planning.  OBJECTIVE IMPAIRMENTS: Abnormal gait, decreased activity tolerance, decreased balance, decreased coordination, impaired flexibility, impaired tone, postural dysfunction, and pain.   PLAN:  PT FREQUENCY: 2x/week  PT DURATION: 8 weeks  PLANNED INTERVENTIONS: Therapeutic exercises, Therapeutic activity, Neuromuscular re-education, Balance training, Gait training, Patient/Family education, Self Care, Joint mobilization, Taping, and Manual therapy  PLAN FOR NEXT  SESSION: check HEP and progress to standing near support surface if able (post it notes on cabinets for rock/reach near support surface),  progress posterior weight shifting and gait activities. CHECK GOALS AND determine renewal versus d/c.  Will need OT and SLP in [place]? Consider adding PWR! Standing video for home [email or web address]  OR [email or web address]   Lucile Hillmann, PT 12/06/2022, 1:59 PM

## 2022-12-11 ENCOUNTER — Telehealth: Payer: Self-pay | Admitting: *Deleted

## 2022-12-11 ENCOUNTER — Ambulatory Visit: Payer: Medicare HMO | Admitting: Physical Therapy

## 2022-12-11 DIAGNOSIS — B351 Tinea unguium: Secondary | ICD-10-CM

## 2022-12-11 NOTE — Telephone Encounter (Signed)
No order was placed.  The  order has been placed and the results are in epic and ready to view.

## 2022-12-12 ENCOUNTER — Encounter: Payer: Self-pay | Admitting: Physical Therapy

## 2022-12-12 ENCOUNTER — Ambulatory Visit: Payer: Medicare HMO | Admitting: Physical Therapy

## 2022-12-12 DIAGNOSIS — R2689 Other abnormalities of gait and mobility: Secondary | ICD-10-CM

## 2022-12-12 DIAGNOSIS — M6281 Muscle weakness (generalized): Secondary | ICD-10-CM | POA: Diagnosis not present

## 2022-12-12 DIAGNOSIS — R2681 Unsteadiness on feet: Secondary | ICD-10-CM | POA: Diagnosis not present

## 2022-12-12 NOTE — Therapy (Signed)
OUTPATIENT PHYSICAL THERAPY AND DISCHARGE  PHYSICAL THERAPY DISCHARGE SUMMARY  Visits from Start of Care: 13  Current functional level related to goals / functional outcomes: See below   Remaining deficits: See below   Education / Equipment: Continuing PWR exercises and community wellness   Patient agrees to discharge. Patient goals were partially met. Patient is being discharged due to being pleased with the current functional level.  Patient Name: Marc Reynolds MRN: [id number] DOB:Aug 17, 1950, 72 y.o., male, male Today's Date: 12/12/2022  PCP: Everrett Coombe, DO REFERRING PROVIDER: Everrett Coombe, DO   END OF SESSION:  PT End of Session - 12/12/22 1615     Visit Number 13    Number of Visits 16    Date for PT Re-Evaluation 12/17/22    Authorization Type aetna medicare    Progress Note Due on Visit 20    PT Start Time 1615    PT Stop Time 1700    PT Time Calculation (min) 45 min    Activity Tolerance Patient tolerated treatment well    Behavior During Therapy [facility] for tasks assessed/performed            Past Medical History:  Diagnosis Date   Anxiety    Cancer (HCC)    Prostate 2022   Depression    Neuromuscular disorder (HCC)    Parkinson's 2021   Varicose veins of legs    History reviewed. No pertinent surgical history. Patient Active Problem List   Diagnosis Date Noted   Left foot pain 11/07/2022   Visit for suture removal 10/15/2022   Primary osteoarthritis of left knee 08/30/2022   Malignant neoplasm of prostate (HCC) 12/04/2020   Well adult exam 12/14/2019   Resting tremor 12/14/2019   Parkinsonism 05/01/2018   Hypertriglyceridemia 09/11/2015   Leukocytosis 09/11/2015   Anxiety and depression 08/11/2015   BPH (benign prostatic hyperplasia) 08/11/2015   ONSET DATE: 10/15/22 REFERRING DIAG: Primary Parkinsonism, balance problem THERAPY DIAG:  Other abnormalities of gait and mobility  Unsteadiness on feet  Muscle weakness (generalized)  Rationale  for Evaluation and Treatment: Rehabilitation  SUBJECTIVE:                                                                                                                                                                                        SUBJECTIVE STATEMENT: Pt reports no falls this week. Has been doing pretty good. Pt states hand is still stiff but is able to put pressure on it.  Pt accompanied by: self PERTINENT HISTORY: dx Parkinson's 2021, anxiety, depression.  PAIN:  Are you having pain? Yes: NPRS scale: 1/10 Pain location: L hand-- sprained during fall Pain description:  sore Aggravating factors: movement Relieving factors: rest PRECAUTIONS: Fall WEIGHT BEARING RESTRICTIONS: No FALLS: Has patient fallen in last 6 months? Yes. Number of falls 1 ; has near falls. LIVING ENVIRONMENT: Lives with: lives with their spouse Lives in: House/apartment Stairs: Yes: External: 1 and 3, depending on door to enter steps; none Has following equipment at home: None PLOF: Independent PATIENT GOALS: Improve balance, return to independent walking.  OBJECTIVE:  (Measures in this section from initial evaluation unless otherwise noted) GAIT: Gait pattern: decreased arm swing- Right, decreased arm swing- Left, decreased step length- Right, decreased step length- Left, decreased stance time- Left, decreased stride length, decreased ankle dorsiflexion- Right, decreased ankle dorsiflexion- Left, knee flexed in stance- Right, knee flexed in stance- Left, and shuffling Distance walked: 200 ft Assistive device utilized: None Level of assistance: Complete Independence FUNCTIONAL TESTS:  5 times sit to stand: 13.45 seconds (eval)  8.09 sec (11/05/22) Timed up and go (TUG): 13.85 seconds (eval) 13.02 sec (11/05/22) TUG cognitive: 17.01 seconds (eval) 15.5 sec (11/05/22) MiniBESTest:  15/32  (<20/32 is fall risk) (eval) 18/32 (11/05/22); 19/32 (12/12/22) Berg=43/56 (eval); 51/56 (12/12/22) 51M: 2.18 ft/sec ;  3.6 ft/sec (12/12/22)   [facility] Adult PT Treatment:                                                DATE: 12/12/22 Neuromuscular re-ed: Standing PWR exercises -- PWR Twist, PWR up Therapeutic Activity: Rechecked goals Stand to floor transfer with mat table assist [facility] Adult PT Treatment:                                                DATE: 12/06/22 Neuromuscular re-ed: Prone PWR! Up -- painful in low back x 2 reps PWR! Twist with cues x 2 reps Standing With pool noodles for UE engagement standing PWR! Stepping to targets with hitting targets with pool noodles PWR! Twist with reaching with pool noodles Agility ladder adding stepping with turns, and wide to narrow standing Quadriped PWR! Up to tall kneel x 5 reps PWR! Twist x 3 reps Gait: Emphasized arm swing by providing rotational cues to scapulae and then using dowels in Ues for arm swing emphasis X 200 ft in hallway in building performing cues for slower pace and taller posture Self Care: Discussed recommendations for Parkinson's exercise (PD foundation recommendations), video for seated PWR! Moves and discussed safety fo rhome   [facility] Adult PT Treatment:                                                DATE: 12/04/22  Neuromuscular re-ed: Standing PWR! Up, PWR! Twist, PWR! Step Ball catch and reach for focus on extension Prone PWR! Up, PWR! Step Quadruped rocking - attempt tall kneeling, pt requires assistance to maintain tall kneeling Gait training with focus on controlling momentum and speed Gait with direction changes Seated PWR! Up with ball Static balance tandem, EC, head turns all with intermittent UE support  PATIENT EDUCATION: Education details: HEP Person educated: Patient Education method: handout, demo, return demo Education comprehension: verbalized understanding, returned  demonstration, and needs further education  HOME EXERCISE PROGRAM: Access Code: [email or web address] URL:  [email or web address] Date: 10/18/2022 Prepared by: Margretta Ditty  Exercises - Sit to Stand with Arm Swing  - 2 x daily - 7 x weekly - 1 sets - 10 reps PWR! Exercise in sitting power up PWR! Step and PWR! Twist added Access Code: [email or web address] URL: [email or web address] Date: 11/05/2022 Prepared by: Reggy Eye  Exercises - Corner Balance Feet Apart: Eyes Open With Head Turns  - 1 x daily - 7 x weekly - 3 sets - 10 reps  GOALS: Goals reviewed with patient? Yes  SHORT TERM GOALS: Target date: 11/15/22  The patient will be indep with initial HEP. Baseline:Initiated at eval. Goal status: MET  2.  The patient will improve 5 time sit<>stand to < or equal to 10 seconds. Baseline: 13.45 seconds Goal status: MET  3.  The patient will improve gait mechanics demonstrating arm swing with gait in clinic. Baseline: No arm swing with gait. Goal status: PARTIALLY MET-- patient needs verbal cues, but can demo arm swing in clinic.  4. The patient will report hand pain 0/10.  Baseline: 4/10 at eval  Goal status: PARTIALLY  MET--2/10 on 11/08/22  LONG TERM GOALS: Target date: 12/13/22  The patient will be indep with progression HEP. Baseline: Initiated at eval Goal status: IN PROGRESS  2.  The patient will improve Berg score to > or equal to 48/56 to demo dec'd risk for falls. Baseline: 43/56 12/12/22: 51/56 Goal status: MET  3.  The patient will improve gait speed to > or equal to 2.8 ft/sec. Baseline:   12/12/22: 3.6 ft/sec Goal status: MET  4.  Improve Mini BEST test to > or equal to 20/32. Baseline: 15/32 12/12/22: 19/32 Goal status: IN PROGRESS  5.  The patient will move floor<>stand mod indep. Baseline:  Notes difficulty Goal status: MET 12/12/22  6.  The patient will demo outdoor walking mod indep x 600 ft. Baseline: Notes not walking alone due to recent fall. Goal status: IN PROGRESS -- walks 1 mile with wife  12/12/22  ASSESSMENT:  CLINICAL IMPRESSION: Rechecked pt's goals. Pt continues to progress towards goals but has not fully met all of them. Encouraged pt to continue his strengthening at home and PWR moves. Sharlene Motts has significantly improved; however, miniBEST has only increased by 1 point since last check. Discussed 6 month follow up to insure pt has been maintaining mobility.   OBJECTIVE IMPAIRMENTS: Abnormal gait, decreased activity tolerance, decreased balance, decreased coordination, impaired flexibility, impaired tone, postural dysfunction, and pain.   PLAN:  PT FREQUENCY: 2x/week  PT DURATION: 8 weeks  PLANNED INTERVENTIONS: Therapeutic exercises, Therapeutic activity, Neuromuscular re-education, Balance training, Gait training, Patient/Family education, Self Care, Joint mobilization, Taping, and Manual therapy  PLAN FOR NEXT SESSION: check HEP and progress to standing near support surface if able (post it notes on cabinets for rock/reach near support surface),  progress posterior weight shifting and gait activities. CHECK GOALS AND determine renewal versus d/c.  Will need OT and SLP in [place]? Consider adding PWR! Standing video for home [email or web address]  OR [email or web address]   Brynlea Spindler April Ma L [place], PT 12/12/2022, 4:46 PM

## 2022-12-13 ENCOUNTER — Ambulatory Visit: Payer: Medicare HMO | Admitting: Physical Therapy

## 2022-12-26 ENCOUNTER — Encounter: Payer: Self-pay | Admitting: Podiatry

## 2022-12-26 ENCOUNTER — Ambulatory Visit: Payer: Medicare HMO | Admitting: Podiatry

## 2022-12-26 ENCOUNTER — Other Ambulatory Visit: Payer: Self-pay | Admitting: Podiatry

## 2022-12-26 DIAGNOSIS — B351 Tinea unguium: Secondary | ICD-10-CM

## 2022-12-26 DIAGNOSIS — D2372 Other benign neoplasm of skin of left lower limb, including hip: Secondary | ICD-10-CM | POA: Diagnosis not present

## 2022-12-26 MED ORDER — TERBINAFINE HCL 250 MG PO TABS: 250.0000 mg | ORAL_TABLET | Freq: Every day | ORAL | 2 refills | Status: AC

## 2022-12-26 MED ORDER — TERBINAFINE HCL 250 MG PO TABS: 250.0000 mg | ORAL_TABLET | Freq: Every day | ORAL | 2 refills | Status: DC

## 2022-12-26 NOTE — Progress Notes (Signed)
  Subjective:  Patient ID: Marc Reynolds, male    DOB: Dec 22, 1950,   MRN: [id number]  Chief Complaint  Patient presents with   Nail Problem    Nail fungus f/u    72 y.o. male presents for follow-up of fungal toenails and to review culture results.  . Denies any other pedal complaints. Denies n/v/f/c.   Past Medical History:  Diagnosis Date   Anxiety    Cancer [facility])    Prostate 2022   Depression    Neuromuscular disorder (HCC)    Parkinson's 2021   Varicose veins of legs     Objective:  Physical Exam: Vascular: DP/PT pulses 2/4 bilateral. CFT <3 seconds. Normal hair growth on digits. No edema.  Skin. No lacerations or abrasions bilateral feet. Nails 1-5 bilateral are thickened elognated and discolored with subungual debris. Hyperkeratotic cored lesion with disruption of skin lines noted to plantar fifth metatarsal head on left.  Musculoskeletal: MMT 5/5 bilateral lower extremities in DF, PF, Inversion and Eversion. Deceased ROM in DF of ankle joint.  Neurological: Sensation intact to light touch.   Assessment:   1. Onychomycosis   2. Benign neoplasm of skin of left foot       Plan:  Patient was evaluated and treated and all questions answered. -Examined patient -Discussed treatment options for painful dystrophic nails  -Cultures positive for fungus.  -Discussed fungal nail treatment options including oral, topical, and laser treatments.  -Patient elects to proceed with lamisil.  -LFTs normal limits in may. Lamisil prescribed.  -Discussed lesions of the skin. with patient and treatment options.  -Hyperkeratotic tissue was debrided with chisel without incident as courtesy today. Nails debrided as well as courtesy.   -Applied salycylic acid treatment to area with dressing. Advised to remove bandaging tomorrow.  -Encouraged daily moisturizing -Discussed use of pumice stone -Advised good supportive shoes and inserts -Patient to return in 3 months for recheck.    Louann Sjogren, DPM

## 2023-01-06 DIAGNOSIS — R4689 Other symptoms and signs involving appearance and behavior: Secondary | ICD-10-CM | POA: Diagnosis not present

## 2023-01-06 DIAGNOSIS — G20C Parkinsonism, unspecified: Secondary | ICD-10-CM | POA: Diagnosis not present

## 2023-01-06 DIAGNOSIS — R4189 Other symptoms and signs involving cognitive functions and awareness: Secondary | ICD-10-CM | POA: Diagnosis not present

## 2023-01-16 ENCOUNTER — Other Ambulatory Visit: Payer: Self-pay | Admitting: Urology

## 2023-01-16 DIAGNOSIS — C61 Malignant neoplasm of prostate: Secondary | ICD-10-CM

## 2023-01-30 DIAGNOSIS — M1990 Unspecified osteoarthritis, unspecified site: Secondary | ICD-10-CM | POA: Diagnosis not present

## 2023-01-30 DIAGNOSIS — Z8546 Personal history of malignant neoplasm of prostate: Secondary | ICD-10-CM | POA: Diagnosis not present

## 2023-01-30 DIAGNOSIS — Z823 Family history of stroke: Secondary | ICD-10-CM | POA: Diagnosis not present

## 2023-01-30 DIAGNOSIS — G629 Polyneuropathy, unspecified: Secondary | ICD-10-CM | POA: Diagnosis not present

## 2023-01-30 DIAGNOSIS — B351 Tinea unguium: Secondary | ICD-10-CM | POA: Diagnosis not present

## 2023-01-30 DIAGNOSIS — K5900 Constipation, unspecified: Secondary | ICD-10-CM | POA: Diagnosis not present

## 2023-01-30 DIAGNOSIS — Z809 Family history of malignant neoplasm, unspecified: Secondary | ICD-10-CM | POA: Diagnosis not present

## 2023-01-30 DIAGNOSIS — E785 Hyperlipidemia, unspecified: Secondary | ICD-10-CM | POA: Diagnosis not present

## 2023-01-30 DIAGNOSIS — Z008 Encounter for other general examination: Secondary | ICD-10-CM | POA: Diagnosis not present

## 2023-01-30 DIAGNOSIS — G20A1 Parkinson's disease without dyskinesia, without mention of fluctuations: Secondary | ICD-10-CM | POA: Diagnosis not present

## 2023-02-17 IMAGING — MR MR PROSTATE WO/W CM
12 series · 48 of 48 positions shown · IV contrast (Multihance 15 ml)
Comparison: None.

CLINICAL DATA: Prostate carcinoma, Gleason score 6.

EXAM:
MR PROSTATE WITHOUT AND WITH CONTRAST
TECHNIQUE: Multiplanar multisequence MRI images were obtained of the pelvis
centered about the prostate. Pre and post contrast images were
obtained.
CONTRAST:  15mL MULTIHANCE GADOBENATE DIMEGLUMINE 529 MG/ML IV SOLN

[Series 3: T2 · coronal · 3.0mm · 0.56mm/px · 1 of 23 slices shown (1 of 3)]
[im 1/23]
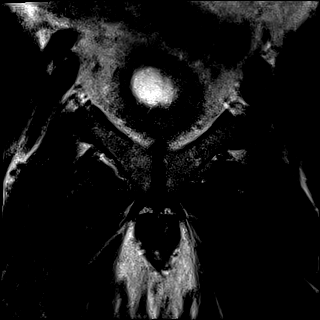

[Series 4: T1 · axial · 5.0mm · 1.25mm/px · 1 of 80 slices shown]
[im 1/80]
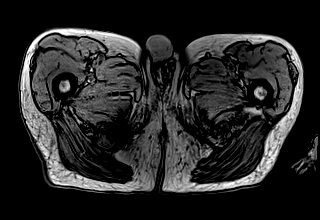

[Series 5: DWI · axial · 3.0mm · 1.75mm/px · z∈[+15,+81]mm · 2 of 69 slices shown (1 of 3)]
[im 1/69]
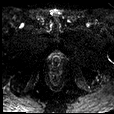
[im 69/69]
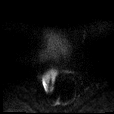

[Series 6: DWI · axial · 3.0mm · 1.75mm/px · 1 of 23 slices shown (2 of 3)]
[im 1/23]
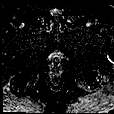

[Series 7: DWI · axial · 3.0mm · 1.75mm/px · 1 of 23 slices shown (3 of 3)]
[im 1/23]
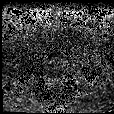

[Series 8: T2 · axial · 3.0mm · 0.56mm/px · 1 of 23 slices shown (2 of 3)]
[im 1/23]
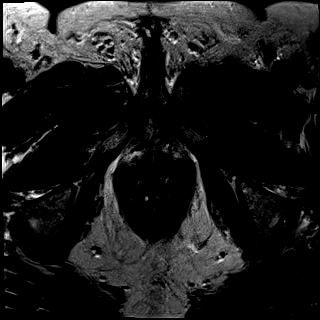

[Series 9: T2 · axial · 1.0mm · 1.04mm/px · z∈[+11,+82]mm · 2 of 72 slices shown (3 of 3)]
[im 1/72]
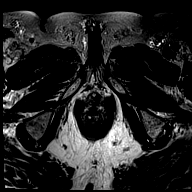
[im 72/72]
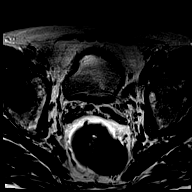

[Series 10: pre t1_twist_tra_dyn · axial · non-contrast · 3.5mm · 0.83mm/px · 1 of 20 slices shown]
[im 1/20]
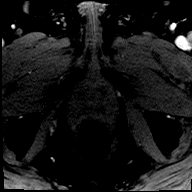

[Series 11: post t1_twist_tra_dyn-copy center · axial · non-contrast · 3.5mm · 0.83mm/px · z∈[+12,+79]mm · 17 of 600 slices shown]
[im 1/600]
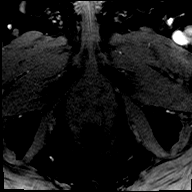
[im 38/600]
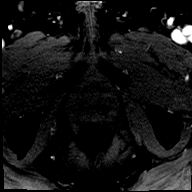
[im 75/600]
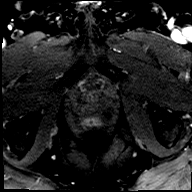
[im 113/600]
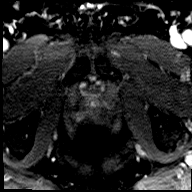
[im 150/600]
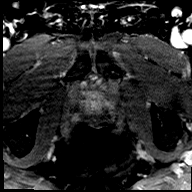
[im 188/600]
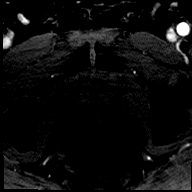
[im 225/600]
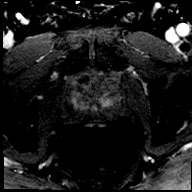
[im 263/600]
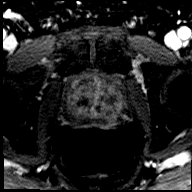
[im 300/600]
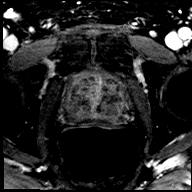
[im 337/600]
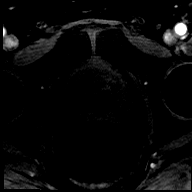
[im 375/600]
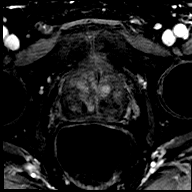
[im 412/600]
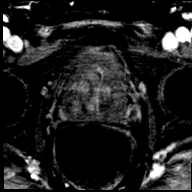
[im 450/600]
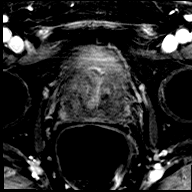
[im 487/600]
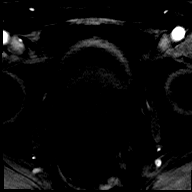
[im 525/600]
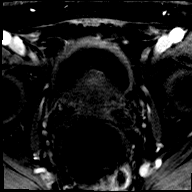
[im 562/600]
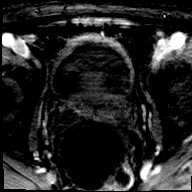
[im 600/600]
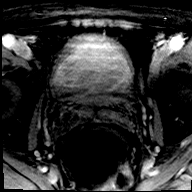

[Series 12: post t1_twist_tra_dyn-copy cent_sub · axial · 3.5mm · 0.83mm/px · z∈[+12,+79]mm · 17 of 579 slices shown]
[im 1/579]
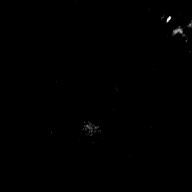
[im 37/579]
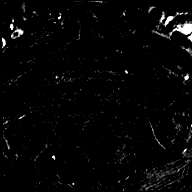
[im 73/579]
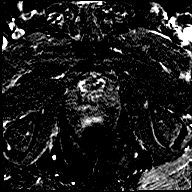
[im 109/579]
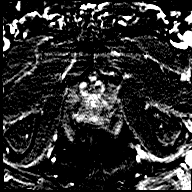
[im 145/579]
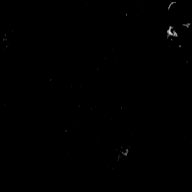
[im 181/579]
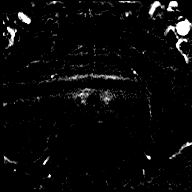
[im 217/579]
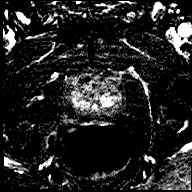
[im 253/579]
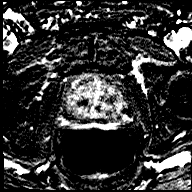
[im 290/579]
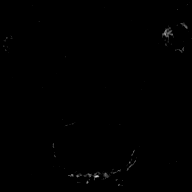
[im 326/579]
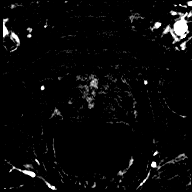
[im 362/579]
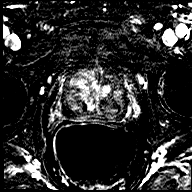
[im 398/579]
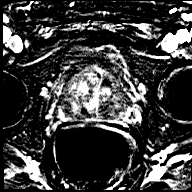
[im 434/579]
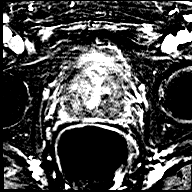
[im 470/579]
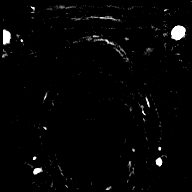
[im 506/579]
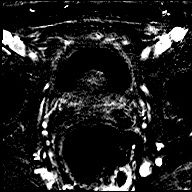
[im 542/579]
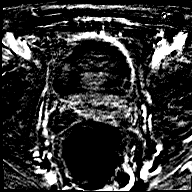
[im 579/579]
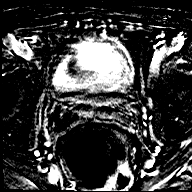

[Series 13: t1_vibe_dixon_tra_f · axial · 2.5mm · 0.91mm/px · z∈[-28,+169]mm · 2 of 80 slices shown]
[im 1/80]
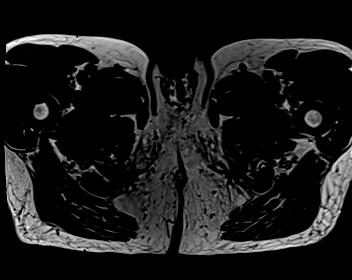
[im 80/80]
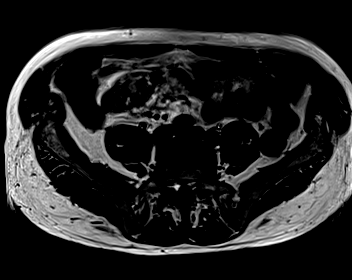

[Series 14: t1_vibe_dixon_tra_w · axial · 2.5mm · 0.91mm/px · z∈[-28,+169]mm · 2 of 80 slices shown]
[im 1/80]
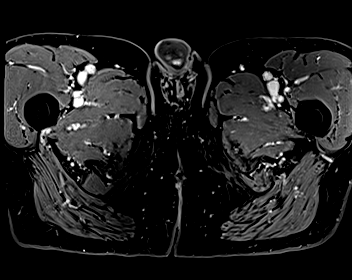
[im 80/80]
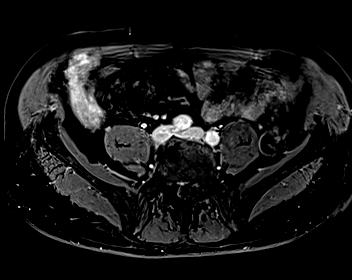

[48 of 48 positions shown; findings below may reference images not displayed]

FINDINGS: Prostate:

-- Peripheral Zone: Linear/wedge shaped hypointensities are noted on
ADC; however, no focal ADC hypointense or high b-value DWI
hyperintense nodules are identified.

-- Transition/Central Zone: Mildly enlarged with encapsulated BPH
nodules noted; however, no suspicious nodules with obscured margins
or significantly restricted diffusion seen.

-- Measurements/Volume:  5.3 by 4.8 x 5.6 cm (volume = 75 cm^3)

Transcapsular spread:  Absent

Seminal vesicle involvement:  Absent

Neurovascular bundle involvement:  Absent

Pelvic adenopathy: None visualized

Bone metastasis: None visualized

Other:  None
IMPRESSION: No radiographic evidence of high-grade prostate carcinoma. PI-RADS 2
(v.2.1): Low (clinically significant cancer unlikely)

## 2023-03-06 DIAGNOSIS — D485 Neoplasm of uncertain behavior of skin: Secondary | ICD-10-CM | POA: Diagnosis not present

## 2023-03-06 DIAGNOSIS — L570 Actinic keratosis: Secondary | ICD-10-CM | POA: Diagnosis not present

## 2023-03-06 DIAGNOSIS — Z85820 Personal history of malignant melanoma of skin: Secondary | ICD-10-CM | POA: Diagnosis not present

## 2023-03-07 ENCOUNTER — Encounter: Payer: Self-pay | Admitting: Family Medicine

## 2023-03-07 DIAGNOSIS — Z1211 Encounter for screening for malignant neoplasm of colon: Secondary | ICD-10-CM

## 2023-03-19 LAB — COLOGUARD

## 2023-03-26 ENCOUNTER — Other Ambulatory Visit: Payer: Medicare HMO

## 2023-04-03 ENCOUNTER — Ambulatory Visit: Payer: Medicare HMO | Admitting: Podiatry

## 2023-04-03 ENCOUNTER — Encounter: Payer: Self-pay | Admitting: Podiatry

## 2023-04-03 DIAGNOSIS — B351 Tinea unguium: Secondary | ICD-10-CM | POA: Diagnosis not present

## 2023-04-03 NOTE — Progress Notes (Signed)
Subjective:  Patient ID: Marc Reynolds, male    DOB: [birth year],   MRN: [id number]  No chief complaint on file.   72 y.o. male presents for follow-up of fungal toenails. He has finished the course of Lamisil. Relates he has noticed some improvement.    . Denies any other pedal complaints. Denies n/v/f/c.   Past Medical History:  Diagnosis Date   Anxiety    Cancer [facility])    Prostate 2022   Depression    Neuromuscular disorder (HCC)    Parkinson's 2021   Varicose veins of legs     Objective:  Physical Exam: Vascular: DP/PT pulses 2/4 bilateral. CFT <3 seconds. Normal hair growth on digits. No edema.  Skin. No lacerations or abrasions bilateral feet. Nails 1-5 bilateral are thickened elognated and discolored with subungual debris some improvemnet in proximal nail fold about 1/8 of the way distally.  Hyperkeratotic cored lesion with disruption of skin lines noted to plantar fifth metatarsal head on left.  Musculoskeletal: MMT 5/5 bilateral lower extremities in DF, PF, Inversion and Eversion. Deceased ROM in DF of ankle joint.  Neurological: Sensation intact to light touch.   Assessment:   1. Onychomycosis       Plan:  Patient was evaluated and treated and all questions answered. -Examined patient -Discussed treatment options for painful dystrophic nails  -Cultures positive for fungus.  -Discussed fungal nail treatment options including oral, topical, and laser treatments.  -Has finished course of lamisil.  Will order more LFTS and if normal will continue for another three months with lamisil as nails growing slowly -Patient to return in 3 months for recheck.    Louann Sjogren, DPM

## 2023-04-04 ENCOUNTER — Other Ambulatory Visit: Payer: Medicare HMO

## 2023-04-04 ENCOUNTER — Other Ambulatory Visit: Payer: Self-pay | Admitting: Podiatry

## 2023-04-04 LAB — HEPATIC FUNCTION PANEL
ALT: 13 [IU]/L (ref 0–44)
AST: 20 [IU]/L (ref 0–40)
Albumin: 4.4 g/dL (ref 3.8–4.8)
Alkaline Phosphatase: 107 [IU]/L (ref 44–121)
Bilirubin Total: 0.3 mg/dL (ref 0.0–1.2)
Bilirubin, Direct: 0.13 mg/dL (ref 0.00–0.40)
Total Protein: 6.8 g/dL (ref 6.0–8.5)

## 2023-04-04 MED ORDER — TERBINAFINE HCL 250 MG PO TABS: 250.0000 mg | ORAL_TABLET | Freq: Every day | ORAL | 0 refills | Status: DC

## 2023-04-07 DIAGNOSIS — Z1211 Encounter for screening for malignant neoplasm of colon: Secondary | ICD-10-CM | POA: Diagnosis not present

## 2023-04-13 LAB — COLOGUARD: COLOGUARD: NEGATIVE

## 2023-04-20 ENCOUNTER — Encounter: Payer: Self-pay | Admitting: Podiatry

## 2023-04-21 ENCOUNTER — Other Ambulatory Visit: Payer: Self-pay | Admitting: Podiatry

## 2023-04-21 MED ORDER — FLUCONAZOLE 150 MG PO TABS: 150.0000 mg | ORAL_TABLET | ORAL | 0 refills | Status: AC

## 2023-04-24 ENCOUNTER — Telehealth: Payer: Self-pay | Admitting: Podiatry

## 2023-04-24 NOTE — Telephone Encounter (Signed)
Yes lets see if we can get him in sooner. I can't see him tomorrow because the rest of the schedule is blocked but if he can go to [place] or see me early next week.

## 2023-04-24 NOTE — Telephone Encounter (Signed)
Patients spouse called and stated that her husband levone is in extreme pain and would need to be seen sooner than the appointment sched for next week on Thursday I told her I would see if you had anything. She said they are open to also coming to the [place] office and are open to seeing other providers if you are unable. Please advise  Thanks !

## 2023-04-28 ENCOUNTER — Ambulatory Visit (INDEPENDENT_AMBULATORY_CARE_PROVIDER_SITE_OTHER): Payer: Medicare HMO

## 2023-04-28 ENCOUNTER — Encounter: Payer: Self-pay | Admitting: Podiatry

## 2023-04-28 ENCOUNTER — Ambulatory Visit: Payer: Medicare HMO | Admitting: Podiatry

## 2023-04-28 DIAGNOSIS — M7751 Other enthesopathy of right foot: Secondary | ICD-10-CM | POA: Diagnosis not present

## 2023-04-28 DIAGNOSIS — M7671 Peroneal tendinitis, right leg: Secondary | ICD-10-CM | POA: Diagnosis not present

## 2023-04-28 MED ORDER — MELOXICAM 15 MG PO TABS: 15.0000 mg | ORAL_TABLET | Freq: Every day | ORAL | 0 refills | Status: DC

## 2023-04-28 MED ORDER — METHYLPREDNISOLONE 4 MG PO TBPK: ORAL_TABLET | ORAL | 0 refills | Status: DC

## 2023-04-28 NOTE — Patient Instructions (Signed)
Peroneal Tendinopathy Rehab Ask your health care provider which exercises are safe for you. Do exercises exactly as told by your health care provider and adjust them as directed. It is normal to feel mild stretching, pulling, tightness, or discomfort as you do these exercises. Stop right away if you feel sudden pain or your pain gets worse. Do not begin these exercises until told by your health care provider. Stretching and range-of-motion exercises These exercises warm up your muscles and joints. They can help improve the movement and flexibility of your ankle. They may also help to relieve pain and stiffness. Gastrocnemius and soleus stretch, standing This is an exercise in which you stand on a step and use your body weight to stretch your calf muscles. To do this exercise: Stand on the edge of a step on the ball of your left / right foot. The ball of your foot is on the walking surface, right under your toes. Keep your other foot firmly on the same step. Hold on to the wall, a railing, or a chair for balance. Slowly lift your other foot, allowing your body weight to press your left / right heel down over the edge of the step. You should feel a stretch in your left / right calf (gastrocnemius and soleus). Hold this position for __________ seconds. Return both feet to the step. Repeat this exercise with a slight bend in your left / right knee. Repeat __________ times with your left / right knee straight and __________ times with your left / right knee bent. Complete this exercise __________ times a day. Strengthening exercises These exercises build strength and endurance in your foot and ankle. Endurance is the ability to use your muscles for a long time, even after they get tired. Ankle dorsiflexion with band  Secure a rubber exercise band or tube to an object, such as a table leg, that will not move when the band is pulled. Secure the other end of the band around your left / right foot. Sit on  the floor. Face the object with your left / right leg extended. The band or tube should be slightly tense when your foot is relaxed. Slowly flex your left / right ankle and toes to bring your foot toward you (dorsiflexion). Hold this position for __________ seconds. Let the band or tube slowly pull your foot back to the starting position. Repeat __________ times. Complete this exercise __________ times a day. Ankle eversion  Sit on the floor with your legs straight out in front of you. Loop a rubber exercise band or tube around the ball of your left / right foot. The ball of your foot is on the walking surface, right under your toes. Hold the ends of the band in your hands. You can also secure the band to a stable object. The band or tube should be slightly tense when your foot is relaxed. Slowly push your foot outward, away from your other leg (eversion). Hold this position for __________ seconds. Slowly return your foot to the starting position. Repeat __________ times. Complete this exercise __________ times a day. Plantar flexion, standing This exercise is sometimes called a standing heel raise. Stand with your feet shoulder-width apart. Place your hands on a wall or table to steady yourself as needed. Try not to use it for support. Keep your weight spread evenly over the width of your feet while you slowly rise up on your toes (plantar flexion). If told by your health care provider: Shift your weight   toward your left / right leg until you feel challenged. Stand on your left / right leg only. Hold this position for __________ seconds. Repeat __________ times. Complete this exercise __________ times a day. Single leg stand  Without shoes, stand near a railing or in a doorway. You may hold on to the railing or doorframe as needed. Stand on your left / right foot. Keep your big toe down on the floor and try to keep your arch lifted. Do not roll to the outside of your foot. If this  exercise is too easy, you can try it with your eyes closed or while standing on a pillow. Hold this position for __________ seconds. Repeat __________ times. Complete this exercise __________ times a day. This information is not intended to replace advice given to you by your health care provider. Make sure you discuss any questions you have with your health care provider. Document Revised: 09/27/2021 Document Reviewed: 09/27/2021 Elsevier Patient Education  2024 [employer] Inc[employer]  

## 2023-04-28 NOTE — Progress Notes (Signed)
  Subjective:  Patient ID: Marc Reynolds, male    DOB: [birth year],   MRN: [id number]  Chief Complaint  Patient presents with   Ankle Pain    Lateral foot/ankle/arch right - aching, swollen, some redness x 2 weeks, was walking a lot [place] [employer] and wondered if that caused it, initially started in the arch, but now more pain in the ankle, tried ice and Ibuprofen, no injury    72 y.o. male presents for concern of new pain in the right foot that started about 2 weeks ago. History as above. History of parkinson's.  . Denies any other pedal complaints. Denies n/v/f/c.   Past Medical History:  Diagnosis Date   Anxiety    Cancer [facility])    Prostate 2022   Depression    Neuromuscular disorder (HCC)    Parkinson's 2021   Varicose veins of legs     Objective:  Physical Exam: Vascular: DP/PT pulses 2/4 bilateral. CFT <3 seconds. Normal hair growth on digits. No edema.  Skin. No lacerations or abrasions bilateral feet.  Musculoskeletal: MMT 5/5 bilateral lower extremities in DF, PF, Inversion and Eversion. Deceased ROM in DF of ankle joint. Tender over the anterior ankle joint line on the right as well as along the course of the peroneal tendon posterior to the lateral malleolus. Pain with PF and eversion and inversion. Mile pain with Rom of the ankle joint. No pain with ROM of the subtalar joint.  Neurological: Sensation intact to light touch.   Assessment:   1. Capsulitis of right ankle   2. Peroneal tendonitis, right      Plan:  Patient was evaluated and treated and all questions answered. X-rays reviewed and discussed with patient. No acute fractures or dislocations noted.  Some mild degnerative changes noted in right ankle and subtalar joint.   Discussed peroneal tendinitis and capuslitis of the ankle  and treatment options at length with patient Discussed stretching exercises and provided handout. Prescription for meloxicam provided and medrol dose pack toe be taken first.   Dispensed Tri-Lock ankle brace. Discussed that if the symptoms do not improve can consider PT/MRI. Patient to return in 4 weeks or sooner if symptoms fail to improve or worsen.   Louann Sjogren, DPM

## 2023-05-02 ENCOUNTER — Ambulatory Visit: Payer: Medicare HMO | Admitting: Podiatry

## 2023-05-29 ENCOUNTER — Ambulatory Visit: Payer: Medicare HMO | Admitting: Podiatry

## 2023-05-29 DIAGNOSIS — B351 Tinea unguium: Secondary | ICD-10-CM | POA: Diagnosis not present

## 2023-05-29 DIAGNOSIS — M7671 Peroneal tendinitis, right leg: Secondary | ICD-10-CM

## 2023-05-29 MED ORDER — TERBINAFINE HCL 250 MG PO TABS: 250.0000 mg | ORAL_TABLET | Freq: Every day | ORAL | 2 refills | Status: AC

## 2023-05-29 MED ORDER — TERBINAFINE HCL 250 MG PO TABS: 250.0000 mg | ORAL_TABLET | Freq: Every day | ORAL | 0 refills | Status: DC

## 2023-05-29 NOTE — Addendum Note (Signed)
Addended by: Louann Sjogren R on: 05/29/2023 11:37 AM   Modules accepted: Orders

## 2023-05-29 NOTE — Progress Notes (Signed)
  Subjective:  Patient ID: Marc Reynolds, male    DOB: January 28, 1951,   MRN: [id number]  No chief complaint on file.   72 y.o. male presents for follow-up of right ankle pain. Relates doing about 60% better has been stretching and wearing brace somewhat and anti-infalmmatories helped. Relates never started next course of lamisil which did help.  History of parkinson's.  . Denies any other pedal complaints. Denies n/v/f/c.   Past Medical History:  Diagnosis Date   Anxiety    Cancer [facility])    Prostate 2022   Depression    Neuromuscular disorder (HCC)    Parkinson's 2021   Varicose veins of legs     Objective:  Physical Exam: Vascular: DP/PT pulses 2/4 bilateral. CFT <3 seconds. Normal hair growth on digits. No edema.  Skin. No lacerations or abrasions bilateral feet. Nails 1-5 bilateral distally with subungual debris and thickness noted.  Musculoskeletal: MMT 5/5 bilateral lower extremities in DF, PF, Inversion and Eversion. Deceased ROM in DF of ankle joint. Tender over the anterior ankle joint line on the right as well as along the course of the peroneal tendon posterior to the lateral malleolus. Pain with PF and eversion and inversion. Mile pain with Rom of the ankle joint. No pain with ROM of the subtalar joint.  Neurological: Sensation intact to light touch.   Assessment:   1. Peroneal tendonitis, right   2. Onychomycosis       Plan:  Patient was evaluated and treated and all questions answered. X-rays reviewed and discussed with patient. No acute fractures or dislocations noted.  Some mild degnerative changes noted in right ankle and subtalar joint.   Discussed peroneal tendinitis and capuslitis of the ankle  and treatment options at length with patient Continue brace and strethcing and anti-infalmmatories  Never received refill of lamisil so resent and will see  if this helps.  Discussed that if the symptoms do not improve can consider PT/MRI. Patient to return as  needed   Louann Sjogren, DPM

## 2023-07-03 ENCOUNTER — Ambulatory Visit: Payer: Medicare Other | Admitting: Podiatry

## 2023-07-03 DIAGNOSIS — M7671 Peroneal tendinitis, right leg: Secondary | ICD-10-CM | POA: Diagnosis not present

## 2023-07-03 DIAGNOSIS — B351 Tinea unguium: Secondary | ICD-10-CM | POA: Diagnosis not present

## 2023-07-03 DIAGNOSIS — G20C Parkinsonism, unspecified: Secondary | ICD-10-CM

## 2023-07-03 NOTE — Progress Notes (Signed)
  Subjective:  Patient ID: Marc Reynolds, male    DOB: [birth year],   MRN: [id number]  No chief complaint on file.   73 y.o. male presents for follow-up of  fungal nails and continue right ankle pain. . Relates doing better but still present and has been stretching and wearing brace somewhat and anti-infalmmatories helped. Relates never started next course of lamisil which did help. Relates he would be interested in PT to help him make sure stretching properly.  History of parkinson's.  . Denies any other pedal complaints. Denies n/v/f/c.   Past Medical History:  Diagnosis Date   Anxiety    Cancer [facility])    Prostate 2022   Depression    Neuromuscular disorder (HCC)    Parkinson's 2021   Varicose veins of legs     Objective:  Physical Exam: Vascular: DP/PT pulses 2/4 bilateral. CFT <3 seconds. Normal hair growth on digits. No edema.  Skin. No lacerations or abrasions bilateral feet. Nails 1-5 bilateral distally with subungual debris and thickness noted.  Musculoskeletal: MMT 5/5 bilateral lower extremities in DF, PF, Inversion and Eversion. Deceased ROM in DF of ankle joint. Tender over the anterior ankle joint line on the right as well as along the course of the peroneal tendon posterior to the lateral malleolus. Pain with PF and eversion and inversion. Mile pain with Rom of the ankle joint. No pain with ROM of the subtalar joint.  Neurological: Sensation intact to light touch.   Assessment:   1. Onychomycosis   2. Peroneal tendonitis, right   3. Primary parkinsonism (HCC)        Plan:  Patient was evaluated and treated and all questions answered. X-rays reviewed and discussed with patient. No acute fractures or dislocations noted.  Some mild degnerative changes noted in right ankle and subtalar joint.   Discussed peroneal tendinitis and capuslitis of the ankle  and treatment options at length with patient Continue brace and strethcing and anti-infalmmatories  Patinet would like to  try PT and referral sent.  Patient still has not picked up lamisil. Disucssed with him to clarify to pick up the terbinafine(lamisil)   Discussed that if the symptoms do not improve can consider PT/MRI. Patient to return in 3 months for recheck.    Louann Sjogren, DPM

## 2023-07-04 ENCOUNTER — Ambulatory Visit: Payer: Medicare HMO | Admitting: Podiatry

## 2023-07-15 ENCOUNTER — Encounter: Payer: Self-pay | Admitting: Physical Therapy

## 2023-07-15 ENCOUNTER — Other Ambulatory Visit: Payer: Self-pay

## 2023-07-15 ENCOUNTER — Ambulatory Visit: Payer: Medicare Other | Attending: Podiatry | Admitting: Physical Therapy

## 2023-07-15 DIAGNOSIS — L814 Other melanin hyperpigmentation: Secondary | ICD-10-CM | POA: Diagnosis not present

## 2023-07-15 DIAGNOSIS — G20C Parkinsonism, unspecified: Secondary | ICD-10-CM | POA: Diagnosis not present

## 2023-07-15 DIAGNOSIS — Z85820 Personal history of malignant melanoma of skin: Secondary | ICD-10-CM | POA: Diagnosis not present

## 2023-07-15 DIAGNOSIS — M6281 Muscle weakness (generalized): Secondary | ICD-10-CM | POA: Diagnosis not present

## 2023-07-15 DIAGNOSIS — D485 Neoplasm of uncertain behavior of skin: Secondary | ICD-10-CM | POA: Diagnosis not present

## 2023-07-15 DIAGNOSIS — M7671 Peroneal tendinitis, right leg: Secondary | ICD-10-CM | POA: Diagnosis not present

## 2023-07-15 DIAGNOSIS — M25571 Pain in right ankle and joints of right foot: Secondary | ICD-10-CM | POA: Diagnosis not present

## 2023-07-15 DIAGNOSIS — L570 Actinic keratosis: Secondary | ICD-10-CM | POA: Diagnosis not present

## 2023-07-15 NOTE — Therapy (Signed)
OUTPATIENT PHYSICAL THERAPY LOWER EXTREMITY EVALUATION   Patient Name: Marc Reynolds MRN: [id number] DOB:May 21, 1951, 73 y.o., male Today's Date: 07/15/2023  END OF SESSION:  PT End of Session - 07/15/23 1531     Visit Number 1    Number of Visits 16    Date for PT Re-Evaluation 09/09/23    Authorization Type [employer] Medicare    Authorization - Visit Number 1    Progress Note Due on Visit 10    PT Start Time 1400    PT Stop Time 1435    PT Time Calculation (min) 35 min    Activity Tolerance Patient tolerated treatment well    Behavior During Therapy [facility] for tasks assessed/performed             Past Medical History:  Diagnosis Date   Anxiety    Cancer (HCC)    Prostate 2022   Depression    Neuromuscular disorder (HCC)    Parkinson's 2021   Varicose veins of legs    History reviewed. No pertinent surgical history. Patient Active Problem List   Diagnosis Date Noted   Left foot pain 11/07/2022   Visit for suture removal 10/15/2022   Primary osteoarthritis of left knee 08/30/2022   Malignant neoplasm of prostate (HCC) 12/04/2020   Well adult exam 12/14/2019   Resting tremor 12/14/2019   Parkinsonism (HCC) 05/01/2018   Hypertriglyceridemia 09/11/2015   Leukocytosis 09/11/2015   Anxiety and depression 08/11/2015   BPH (benign prostatic hyperplasia) 08/11/2015    PCP: Everrett Coombe  REFERRING PROVIDER: Ralene Cork  REFERRING DIAG: peroneal tendonitis  THERAPY DIAG:  Pain in right ankle and joints of right foot  Muscle weakness (generalized)  Rationale for Evaluation and Treatment: Rehabilitation  ONSET DATE: 04/2023  SUBJECTIVE:   SUBJECTIVE STATEMENT: Pt states he was walking a lot this fall and he developed Rt ankle pain. The pain is in the ball of the foot and the top of the foot. Pt states with prolonged standing he feels like the bottom of his foot is "on fire". Pain resolves when not weight bearing. Pt states the MD gave him medicine and that his pain is  "better than it was" but he still feels his ankles are "weak". Pt also complains of decreased LE strength and difficulty getting up and down from floor  PERTINENT HISTORY: Parkinson's Disease PAIN:  Are you having pain? Yes: NPRS scale: 0/10 at rest, 8/10 at worst Pain location: ball of Rt foot Pain description: burning/sharp Aggravating factors: standing or walking for prolonged time Relieving factors: rest, meds  PRECAUTIONS: Fall  RED FLAGS: None   WEIGHT BEARING RESTRICTIONS: No  FALLS:  Has patient fallen in last 6 months? No   OCCUPATION: Retired  PLOF: Independent  PATIENT GOALS: reduce pain  NEXT MD VISIT: PRN  OBJECTIVE:  Note: Objective measures were completed at Evaluation unless otherwise noted.  DIAGNOSTIC FINDINGS: Rt ankle x ray:  No acute fractures or dislocations noted.  Some mild degnerative changes noted in right ankle and subtalar joint    POSTURE: rounded shoulders and forward head  PALPATION: Mild TTP Rt plantar fascia, Rt lateral malleolus  LOWER EXTREMITY ROM:  Active ROM Right eval Left eval  Hip flexion    Hip extension    Hip abduction    Hip adduction    Hip internal rotation    Hip external rotation    Knee flexion    Knee extension    Ankle dorsiflexion 1 5  Ankle plantarflexion 65 62  Ankle inversion 25 35  Ankle eversion 15 25   (Blank rows = not tested)  LOWER EXTREMITY MMT:  MMT Right eval Left eval  Hip flexion    Hip extension    Hip abduction    Hip adduction    Hip internal rotation    Hip external rotation    Knee flexion    Knee extension    Ankle dorsiflexion 4 4  Ankle plantarflexion 4- 4  Ankle inversion 4 4  Ankle eversion 3+ 4   (Blank rows = not tested)    FUNCTIONAL TESTS:  No pain with SLS (with light UE support) bilat 5 x STS: 13.91  No change in foot/ankle symptoms with lumbar flexion/ext                                                                                                                                TREATMENT DATE: 07/15/23 See HEP Pt educated on PT POC and goals, relevant anatomy and rationale for treatment    PATIENT EDUCATION:  Education details: PT POC and goals, HEP Person educated: Patient Education method: Explanation, Demonstration, and Handouts Education comprehension: verbalized understanding and returned demonstration  HOME EXERCISE PROGRAM: Access Code: [email or web address] URL: [email or web address] Date: 07/15/2023 Prepared by: Reggy Eye  Exercises - Ankle Inversion Eversion Towel Slide  - 1 x daily - 7 x weekly - 3 sets - 10 reps - Towel Scrunches  - 1 x daily - 7 x weekly - 3 sets - 10 reps - Seated Plantar Fascia Mobilization with Small Ball  - 1 x daily - 7 x weekly - 1 sets - 3 reps - 1-2 minutes hold - Gastroc Stretch on Wall  - 1 x daily - 7 x weekly - 1 sets - 3 reps - 20-30 sec hold - Sit to Stand  - 1 x daily - 7 x weekly - 2 sets - 10 reps  ASSESSMENT:  CLINICAL IMPRESSION: Patient is a 73 y.o. male who was seen today for physical therapy evaluation and treatment for Rt ankle pain. He presents with decreased Rt ankle strength, mobility, ROM and decreased overall activity tolerance and increased pain. He will benefit from skilled PT to address deficits and improve functional mobility.   OBJECTIVE IMPAIRMENTS: decreased activity tolerance, decreased balance, difficulty walking, decreased ROM, decreased strength, and pain.   ACTIVITY LIMITATIONS: standing and locomotion level  PARTICIPATION LIMITATIONS: cleaning and community activity  PERSONAL FACTORS: Time since onset of injury/illness/exacerbation and 1 comorbidity: parkinson's disease  are also affecting patient's functional outcome.   REHAB POTENTIAL: Good  CLINICAL DECISION MAKING: Stable/uncomplicated  EVALUATION COMPLEXITY: Low   GOALS: Goals reviewed with patient? Yes  SHORT TERM GOALS: Target date: 08/12/2023   Pt will be independent in  initial HEP Baseline: Goal status: INITIAL  2.  Pt will report standing or walking x 15 minutes with pain < = 3/10 Baseline: 8/10 Goal status: INITIAL    LONG TERM  GOALS: Target date: 09/09/2023   Pt will be independent with advanced HEP Baseline:  Goal status: INITIAL  2.  Pt will demo increased leg strength by perform 5x STS <= 11 seconds Baseline:  Goal status: INITIAL  3.  Pt will stand or walk x 15 minutes with pain <= 1/10 Baseline:  Goal status: INITIAL  4.  Pt will improve Rt ankle strength to 4/5 to improve standing and walking tolerance Baseline:  Goal status: INITIAL    PLAN:  PT FREQUENCY: 1-2x/week  PT DURATION: 8 weeks  PLANNED INTERVENTIONS: 97164- PT Re-evaluation, 97110-Therapeutic exercises, 97530- Therapeutic activity, [id number]- Neuromuscular re-education, 97535- Self Care, 11914- Manual therapy, [id number]- Aquatic Therapy, Patient/Family education, Balance training, Taping, Dry Needling, Cryotherapy, and Moist heat  PLAN FOR NEXT SESSION: assess response to HEP. Ankle stability/balance exercises, ankle strength   Adryan Druckenmiller, PT 07/15/2023, 3:33 PM

## 2023-07-23 ENCOUNTER — Ambulatory Visit: Payer: Medicare Other | Attending: Podiatry | Admitting: Physical Therapy

## 2023-07-23 ENCOUNTER — Encounter: Payer: Self-pay | Admitting: Physical Therapy

## 2023-07-23 DIAGNOSIS — M25571 Pain in right ankle and joints of right foot: Secondary | ICD-10-CM | POA: Diagnosis not present

## 2023-07-23 DIAGNOSIS — M6281 Muscle weakness (generalized): Secondary | ICD-10-CM | POA: Diagnosis not present

## 2023-07-23 NOTE — Therapy (Signed)
 OUTPATIENT PHYSICAL THERAPY LOWER EXTREMITY TREATMENT   Patient Name: Marc Reynolds MRN: [id number] DOB:02-24-1951, 73 y.o., male Today's Date: 07/23/2023  END OF SESSION:  PT End of Session - 07/23/23 1139     Visit Number 2    Number of Visits 16    Date for PT Re-Evaluation 09/09/23    Authorization Type [employer] Medicare    Authorization - Visit Number 2    Progress Note Due on Visit 10    PT Start Time 1100    PT Stop Time 1139    PT Time Calculation (min) 39 min    Activity Tolerance Patient tolerated treatment well    Behavior During Therapy [facility] for tasks assessed/performed              Past Medical History:  Diagnosis Date   Anxiety    Cancer (HCC)    Prostate 2022   Depression    Neuromuscular disorder (HCC)    Parkinson's 2021   Varicose veins of legs    History reviewed. No pertinent surgical history. Patient Active Problem List   Diagnosis Date Noted   Left foot pain 11/07/2022   Visit for suture removal 10/15/2022   Primary osteoarthritis of left knee 08/30/2022   Malignant neoplasm of prostate (HCC) 12/04/2020   Well adult exam 12/14/2019   Resting tremor 12/14/2019   Parkinsonism (HCC) 05/01/2018   Hypertriglyceridemia 09/11/2015   Leukocytosis 09/11/2015   Anxiety and depression 08/11/2015   BPH (benign prostatic hyperplasia) 08/11/2015    PCP: Alvia Bring  REFERRING PROVIDER: Joya  REFERRING DIAG: peroneal tendonitis  THERAPY DIAG:  Pain in right ankle and joints of right foot  Muscle weakness (generalized)  Rationale for Evaluation and Treatment: Rehabilitation  ONSET DATE: 04/2023  SUBJECTIVE:   SUBJECTIVE STATEMENT: Pt states his ankle is feeling better. He thinks the exercises have helped. He was able to walk 2.5 miles yesterday without increased pain  PERTINENT HISTORY: Parkinson's Disease  Pt states he was walking a lot this fall and he developed Rt ankle pain. The pain is in the ball of the foot and the top of the foot.  Pt states with prolonged standing he feels like the bottom of his foot is on fire. Pain resolves when not weight bearing. Pt states the MD gave him medicine and that his pain is better than it was but he still feels his ankles are weak. Pt also complains of decreased LE strength and difficulty getting up and down from floor PAIN:  Are you having pain? Yes: NPRS scale: 0/10 at rest, 8/10 at worst Pain location: ball of Rt foot Pain description: burning/sharp Aggravating factors: standing or walking for prolonged time Relieving factors: rest, meds  PRECAUTIONS: Fall  RED FLAGS: None   WEIGHT BEARING RESTRICTIONS: No  FALLS:  Has patient fallen in last 6 months? No   OCCUPATION: Retired  PLOF: Independent  PATIENT GOALS: reduce pain  NEXT MD VISIT: PRN  OBJECTIVE:  Note: Objective measures were completed at Evaluation unless otherwise noted.  DIAGNOSTIC FINDINGS: Rt ankle x ray:  No acute fractures or dislocations noted.  Some mild degnerative changes noted in right ankle and subtalar joint    POSTURE: rounded shoulders and forward head  PALPATION: Mild TTP Rt plantar fascia, Rt lateral malleolus  LOWER EXTREMITY ROM:  Active ROM Right eval Left eval  Hip flexion    Hip extension    Hip abduction    Hip adduction    Hip internal rotation  Hip external rotation    Knee flexion    Knee extension    Ankle dorsiflexion 1 5  Ankle plantarflexion 65 62  Ankle inversion 25 35  Ankle eversion 15 25   (Blank rows = not tested)  LOWER EXTREMITY MMT:  MMT Right eval Left eval  Hip flexion    Hip extension    Hip abduction    Hip adduction    Hip internal rotation    Hip external rotation    Knee flexion    Knee extension    Ankle dorsiflexion 4 4  Ankle plantarflexion 4- 4  Ankle inversion 4 4  Ankle eversion 3+ 4   (Blank rows = not tested)    FUNCTIONAL TESTS:  No pain with SLS (with light UE support) bilat 5 x STS: 13.91  No change in  foot/ankle symptoms with lumbar flexion/ext                                                                                                                               [facility] Adult PT Treatment:                                                DATE: 07/23/23 Therapeutic Exercise/Therapeutic Activity/ NMR: Nustep L6 x 5 min for warm up Heel/toe raises x 20 Standing on airex: narrow BOS, narrow BOS with head turns - CGA/min A, tandem stance - CGA BAPS with assistance inv/ev, DF/PF - some pain in lateral ankle with inversion, pain improved with smaller ROM Ankle inversion/eversion towel slide Ankle pronation with manual assistance Seated ball squeeze between heels STS x 10 without UE support Tandem walking fwd/bkwd with counter support   TREATMENT DATE: 07/15/23 See HEP Pt educated on PT POC and goals, relevant anatomy and rationale for treatment    PATIENT EDUCATION:  Education details: PT POC and goals, HEP Person educated: Patient Education method: Explanation, Demonstration, and Handouts Education comprehension: verbalized understanding and returned demonstration  HOME EXERCISE PROGRAM: Access Code: [email or web address] URL: [email or web address] Date: 07/15/2023 Prepared by: Darice Conine  Exercises - Ankle Inversion Eversion Towel Slide  - 1 x daily - 7 x weekly - 3 sets - 10 reps - Towel Scrunches  - 1 x daily - 7 x weekly - 3 sets - 10 reps - Seated Plantar Fascia Mobilization with Small Ball  - 1 x daily - 7 x weekly - 1 sets - 3 reps - 1-2 minutes hold - Gastroc Stretch on Wall  - 1 x daily - 7 x weekly - 1 sets - 3 reps - 20-30 sec hold - Sit to Stand  - 1 x daily - 7 x weekly - 2 sets - 10 reps  ASSESSMENT:  CLINICAL IMPRESSION: Pt with decreased pain vs eval. He holds Rt ankle in supination so focused  on trying to improve pronation during functional activities. He will continue to benefit from ankle strengthening and balance training  OBJECTIVE IMPAIRMENTS:  decreased activity tolerance, decreased balance, difficulty walking, decreased ROM, decreased strength, and pain.     GOALS: Goals reviewed with patient? Yes  SHORT TERM GOALS: Target date: 08/12/2023   Pt will be independent in initial HEP Baseline: Goal status: INITIAL  2.  Pt will report standing or walking x 15 minutes with pain < = 3/10 Baseline: 8/10 Goal status: INITIAL    LONG TERM GOALS: Target date: 09/09/2023   Pt will be independent with advanced HEP Baseline:  Goal status: INITIAL  2.  Pt will demo increased leg strength by perform 5x STS <= 11 seconds Baseline:  Goal status: INITIAL  3.  Pt will stand or walk x 15 minutes with pain <= 1/10 Baseline:  Goal status: INITIAL  4.  Pt will improve Rt ankle strength to 4/5 to improve standing and walking tolerance Baseline:  Goal status: INITIAL    PLAN:  PT FREQUENCY: 1-2x/week  PT DURATION: 8 weeks  PLANNED INTERVENTIONS: 97164- PT Re-evaluation, 97110-Therapeutic exercises, 97530- Therapeutic activity, [id number]- Neuromuscular re-education, 97535- Self Care, [id number]- Manual therapy, [id number]- Aquatic Therapy, Patient/Family education, Balance training, Taping, Dry Needling, Cryotherapy, and Moist heat  PLAN FOR NEXT SESSION:  UPDATE HEP, Ankle stability/balance exercises, ankle strength   Sherryn Pollino, PT 07/23/2023, 11:39 AM

## 2023-07-30 ENCOUNTER — Encounter: Payer: Self-pay | Admitting: Podiatry

## 2023-07-30 ENCOUNTER — Encounter: Payer: Self-pay | Admitting: Physical Therapy

## 2023-07-30 ENCOUNTER — Ambulatory Visit: Payer: Medicare Other | Admitting: Physical Therapy

## 2023-07-30 DIAGNOSIS — M25571 Pain in right ankle and joints of right foot: Secondary | ICD-10-CM

## 2023-07-30 DIAGNOSIS — M6281 Muscle weakness (generalized): Secondary | ICD-10-CM | POA: Diagnosis not present

## 2023-07-30 NOTE — Therapy (Signed)
OUTPATIENT PHYSICAL THERAPY LOWER EXTREMITY TREATMENT   Patient Name: Marc Reynolds MRN: [id number] DOB:[birth year], 73 y.o., male Today's Date: 07/30/2023  END OF SESSION:  PT End of Session - 07/30/23 1524     Visit Number 3    Number of Visits 16    Date for PT Re-Evaluation 09/09/23    Authorization Type [employer] Medicare    Authorization Time Period 16 approved through 09/09/23    Authorization - Visit Number 3    Progress Note Due on Visit 10    PT Start Time 1445    PT Stop Time 1526    PT Time Calculation (min) 41 min    Activity Tolerance Patient tolerated treatment well    Behavior During Therapy [facility] for tasks assessed/performed               Past Medical History:  Diagnosis Date   Anxiety    Cancer (HCC)    Prostate 2022   Depression    Neuromuscular disorder (HCC)    Parkinson's 2021   Varicose veins of legs    History reviewed. No pertinent surgical history. Patient Active Problem List   Diagnosis Date Noted   Left foot pain 11/07/2022   Visit for suture removal 10/15/2022   Primary osteoarthritis of left knee 08/30/2022   Malignant neoplasm of prostate (HCC) 12/04/2020   Well adult exam 12/14/2019   Resting tremor 12/14/2019   Parkinsonism (HCC) 05/01/2018   Hypertriglyceridemia 09/11/2015   Leukocytosis 09/11/2015   Anxiety and depression 08/11/2015   BPH (benign prostatic hyperplasia) 08/11/2015    PCP: Everrett Coombe  REFERRING PROVIDER: Ralene Cork  REFERRING DIAG: peroneal tendonitis  THERAPY DIAG:  Pain in right ankle and joints of right foot  Muscle weakness (generalized)  Rationale for Evaluation and Treatment: Rehabilitation  ONSET DATE: 04/2023  SUBJECTIVE:   SUBJECTIVE STATEMENT: Pt states his foot is sore today. He feels pain on the bottom of his foot on the lateral side  PERTINENT HISTORY: Parkinson's Disease  Pt states he was walking a lot this fall and he developed Rt ankle pain. The pain is in the ball of the foot and the  top of the foot. Pt states with prolonged standing he feels like the bottom of his foot is "on fire". Pain resolves when not weight bearing. Pt states the MD gave him medicine and that his pain is "better than it was" but he still feels his ankles are "weak". Pt also complains of decreased LE strength and difficulty getting up and down from floor PAIN:  Are you having pain? Yes: NPRS scale: 3/10 at rest, 8/10 at worst Pain location: ball of Rt foot Pain description: burning/sharp Aggravating factors: standing or walking for prolonged time Relieving factors: rest, meds  PRECAUTIONS: Fall  RED FLAGS: None   WEIGHT BEARING RESTRICTIONS: No  FALLS:  Has patient fallen in last 6 months? No   OCCUPATION: Retired  PLOF: Independent  PATIENT GOALS: reduce pain  NEXT MD VISIT: PRN  OBJECTIVE:  Note: Objective measures were completed at Evaluation unless otherwise noted.  DIAGNOSTIC FINDINGS: Rt ankle x ray:  No acute fractures or dislocations noted.  Some mild degnerative changes noted in right ankle and subtalar joint    POSTURE: rounded shoulders and forward head  PALPATION: Mild TTP Rt plantar fascia, Rt lateral malleolus  LOWER EXTREMITY ROM:  Active ROM Right eval Left eval  Hip flexion    Hip extension    Hip abduction    Hip adduction  Hip internal rotation    Hip external rotation    Knee flexion    Knee extension    Ankle dorsiflexion 1 5  Ankle plantarflexion 65 62  Ankle inversion 25 35  Ankle eversion 15 25   (Blank rows = not tested)  LOWER EXTREMITY MMT:  MMT Right eval Left eval  Hip flexion    Hip extension    Hip abduction    Hip adduction    Hip internal rotation    Hip external rotation    Knee flexion    Knee extension    Ankle dorsiflexion 4 4  Ankle plantarflexion 4- 4  Ankle inversion 4 4  Ankle eversion 3+ 4   (Blank rows = not tested)    FUNCTIONAL TESTS:  No pain with SLS (with light UE support) bilat 5 x STS:  13.91  No change in foot/ankle symptoms with lumbar flexion/ext                                                                                                                               [facility] Adult PT Treatment:                                                DATE: 07/30/23 Therapeutic Exercise/Activity: Nustep L6 x 5 min for warm up Ankle pronation with manual assistance to reduce supination in ankle at rest Seated rocker board for DF/PF ROM BAPS with assistance inv/ev, DF/PF, circles Seated self ankle PROM Rocker board lateral wt shift x 1 min Heel raises off step x 10 Sit <> stand x 10 Manual Therapy: TC joint mobs grade 2-3 Rt Ankle PROM   [facility] Adult PT Treatment:                                                DATE: 07/23/23 Therapeutic Exercise/Therapeutic Activity/ NMR: Nustep L6 x 5 min for warm up Heel/toe raises x 20 Standing on airex: narrow BOS, narrow BOS with head turns - CGA/min A, tandem stance - CGA BAPS with assistance inv/ev, DF/PF - some pain in lateral ankle with inversion, pain improved with smaller ROM Ankle inversion/eversion towel slide Ankle pronation with manual assistance Seated ball squeeze between heels STS x 10 without UE support Tandem walking fwd/bkwd with counter support   TREATMENT DATE: 07/15/23 See HEP Pt educated on PT POC and goals, relevant anatomy and rationale for treatment    PATIENT EDUCATION:  Education details: PT POC and goals, HEP Person educated: Patient Education method: Explanation, Demonstration, and Handouts Education comprehension: verbalized understanding and returned demonstration  HOME EXERCISE PROGRAM: Access Code: [email or web address] URL: [email or web address] Date: 07/30/2023 Prepared by: Reggy Eye  Exercises -  Ankle Inversion Eversion Towel Slide  - 1 x daily - 7 x weekly - 3 sets - 10 reps - Seated Plantar Fascia Mobilization with Small Ball  - 1 x daily - 7 x weekly - 1 sets - 3 reps - 1-2 minutes  hold - Gastroc Stretch on Wall  - 1 x daily - 7 x weekly - 1 sets - 3 reps - 20-30 sec hold - Sit to Stand  - 1 x daily - 7 x weekly - 2 sets - 10 reps - Seated Ankle Inversion Eversion PROM  - 1 x daily - 7 x weekly - 3 sets - 10 reps - Heel Raise on Step  - 1 x daily - 7 x weekly - 3 sets - 10 reps - Standing Bilateral Heel Raise on Step  - 1 x daily - 7 x weekly - 3 sets - 10 reps  ASSESSMENT:  CLINICAL IMPRESSION: Pt with improvements in ankle mobility after manual work. Updated HEP to include ankle stretching in seated at home to help maintain gains in mobility. Pt with improving tolerance to eccentric strengthening. Added heel lowering off step to HEP  OBJECTIVE IMPAIRMENTS: decreased activity tolerance, decreased balance, difficulty walking, decreased ROM, decreased strength, and pain.     GOALS: Goals reviewed with patient? Yes  SHORT TERM GOALS: Target date: 08/12/2023   Pt will be independent in initial HEP Baseline: Goal status: INITIAL  2.  Pt will report standing or walking x 15 minutes with pain < = 3/10 Baseline: 8/10 Goal status: INITIAL    LONG TERM GOALS: Target date: 09/09/2023   Pt will be independent with advanced HEP Baseline:  Goal status: INITIAL  2.  Pt will demo increased leg strength by perform 5x STS <= 11 seconds Baseline:  Goal status: INITIAL  3.  Pt will stand or walk x 15 minutes with pain <= 1/10 Baseline:  Goal status: INITIAL  4.  Pt will improve Rt ankle strength to 4/5 to improve standing and walking tolerance Baseline:  Goal status: INITIAL    PLAN:  PT FREQUENCY: 1-2x/week  PT DURATION: 8 weeks  PLANNED INTERVENTIONS: 97164- PT Re-evaluation, 97110-Therapeutic exercises, 97530- Therapeutic activity, [id number]- Neuromuscular re-education, 97535- Self Care, [id number]- Manual therapy, [id number]- Aquatic Therapy, Patient/Family education, Balance training, Taping, Dry Needling, Cryotherapy, and Moist heat  PLAN FOR NEXT SESSION:    Ankle stability/balance exercises, ankle strength   Zhana Jeangilles, PT 07/30/2023, 3:25 PM

## 2023-07-31 ENCOUNTER — Other Ambulatory Visit: Payer: Self-pay | Admitting: Podiatry

## 2023-07-31 ENCOUNTER — Other Ambulatory Visit: Payer: Self-pay | Admitting: Sports Medicine

## 2023-07-31 DIAGNOSIS — M25562 Pain in left knee: Secondary | ICD-10-CM

## 2023-08-06 ENCOUNTER — Ambulatory Visit: Payer: Medicare Other | Admitting: Physical Therapy

## 2023-08-07 ENCOUNTER — Ambulatory Visit: Payer: Medicare Other | Admitting: Physical Therapy

## 2023-08-13 ENCOUNTER — Ambulatory Visit: Payer: Medicare Other | Admitting: Physical Therapy

## 2023-08-13 ENCOUNTER — Encounter: Payer: Self-pay | Admitting: Physical Therapy

## 2023-08-13 DIAGNOSIS — M6281 Muscle weakness (generalized): Secondary | ICD-10-CM | POA: Diagnosis not present

## 2023-08-13 DIAGNOSIS — M25571 Pain in right ankle and joints of right foot: Secondary | ICD-10-CM

## 2023-08-13 NOTE — Therapy (Signed)
 OUTPATIENT PHYSICAL THERAPY LOWER EXTREMITY TREATMENT   Patient Name: Marc Reynolds MRN: [id number] DOB:Jan 31, 1951, 73 y.o., male Today's Date: 08/13/2023  END OF SESSION:  PT End of Session - 08/13/23 1531     Visit Number 4    Number of Visits 16    Date for PT Re-Evaluation 09/09/23    Authorization Type [employer] Medicare    Authorization Time Period 16 approved through 09/09/23    Authorization - Visit Number 4    Progress Note Due on Visit 10    PT Start Time 1445    PT Stop Time 1530    PT Time Calculation (min) 45 min    Activity Tolerance Patient tolerated treatment well    Behavior During Therapy [facility] for tasks assessed/performed                Past Medical History:  Diagnosis Date   Anxiety    Cancer (HCC)    Prostate 2022   Depression    Neuromuscular disorder (HCC)    Parkinson's 2021   Varicose veins of legs    History reviewed. No pertinent surgical history. Patient Active Problem List   Diagnosis Date Noted   Left foot pain 11/07/2022   Visit for suture removal 10/15/2022   Primary osteoarthritis of left knee 08/30/2022   Malignant neoplasm of prostate (HCC) 12/04/2020   Well adult exam 12/14/2019   Resting tremor 12/14/2019   Parkinsonism (HCC) 05/01/2018   Hypertriglyceridemia 09/11/2015   Leukocytosis 09/11/2015   Anxiety and depression 08/11/2015   BPH (benign prostatic hyperplasia) 08/11/2015    PCP: Everrett Coombe  REFERRING PROVIDER: Ralene Cork  REFERRING DIAG: peroneal tendonitis  THERAPY DIAG:  Pain in right ankle and joints of right foot  Muscle weakness (generalized)  Rationale for Evaluation and Treatment: Rehabilitation  ONSET DATE: 04/2023  SUBJECTIVE:   SUBJECTIVE STATEMENT: Pt states he put some moleskin in his shoe and it has decreased some of his pain. He does have some pain in the ball of his Lt foot. He states that he feels more confident since starting PT  PERTINENT HISTORY: Parkinson's Disease  Pt states he was  walking a lot this fall and he developed Rt ankle pain. The pain is in the ball of the foot and the top of the foot. Pt states with prolonged standing he feels like the bottom of his foot is "on fire". Pain resolves when not weight bearing. Pt states the MD gave him medicine and that his pain is "better than it was" but he still feels his ankles are "weak". Pt also complains of decreased LE strength and difficulty getting up and down from floor PAIN:  Are you having pain? Yes: NPRS scale: 3/10 at rest, 4/10 at worst Pain location: ball of Rt foot Pain description: burning/sharp Aggravating factors: standing or walking for prolonged time Relieving factors: rest, meds  PRECAUTIONS: Fall  RED FLAGS: None   WEIGHT BEARING RESTRICTIONS: No  FALLS:  Has patient fallen in last 6 months? No   OCCUPATION: Retired  PLOF: Independent  PATIENT GOALS: reduce pain  NEXT MD VISIT: PRN  OBJECTIVE:  Note: Objective measures were completed at Evaluation unless otherwise noted.  DIAGNOSTIC FINDINGS: Rt ankle x ray:  No acute fractures or dislocations noted.  Some mild degnerative changes noted in right ankle and subtalar joint    POSTURE: rounded shoulders and forward head  PALPATION: Mild TTP Rt plantar fascia, Rt lateral malleolus  LOWER EXTREMITY ROM:  Active ROM Right eval Left  eval  Hip flexion    Hip extension    Hip abduction    Hip adduction    Hip internal rotation    Hip external rotation    Knee flexion    Knee extension    Ankle dorsiflexion 1 5  Ankle plantarflexion 65 62  Ankle inversion 25 35  Ankle eversion 15 25   (Blank rows = not tested)  LOWER EXTREMITY MMT:  MMT Right eval Left eval  Hip flexion    Hip extension    Hip abduction    Hip adduction    Hip internal rotation    Hip external rotation    Knee flexion    Knee extension    Ankle dorsiflexion 4 4  Ankle plantarflexion 4- 4  Ankle inversion 4 4  Ankle eversion 3+ 4   (Blank rows = not  tested)    FUNCTIONAL TESTS:  No pain with SLS (with light UE support) bilat 5 x STS: 13.91  No change in foot/ankle symptoms with lumbar flexion/ext                                                                                                                               [facility] Adult PT Treatment:                                                DATE: 08/13/23 Therapeutic Exercise/Activity/NMR: Nustep L6 x 5 min Seated rocker board A/P for ankle PF/DF ROM x 1 min bilat Seated ankle pronation on dynadisc Heel raises on step x 12 Heel raise with ball between heels x 12 SLS arch unsupported with 1 UE support x 30 sec bilat - only able to hold x 20 sec on Rt ankle SLS on foam with 1 UE support 2 x 30 sec bilat Sit <> stand 2 x 5 Discussed floor transfer technique and ideas to make floor transfer more safe and efficient  Manual Therapy: TC joint mobs grade 2-3 bilat Ankle PROM   [facility] Adult PT Treatment:                                                DATE: 07/30/23 Therapeutic Exercise/Activity: Nustep L6 x 5 min for warm up Ankle pronation with manual assistance to reduce supination in ankle at rest Seated rocker board for DF/PF ROM BAPS with assistance inv/ev, DF/PF, circles Seated self ankle PROM Rocker board lateral wt shift x 1 min Heel raises off step x 10 Sit <> stand x 10 Manual Therapy: TC joint mobs grade 2-3 Rt Ankle PROM   [facility] Adult PT Treatment:  DATE: 07/23/23 Therapeutic Exercise/Therapeutic Activity/ NMR: Nustep L6 x 5 min for warm up Heel/toe raises x 20 Standing on airex: narrow BOS, narrow BOS with head turns - CGA/min A, tandem stance - CGA BAPS with assistance inv/ev, DF/PF - some pain in lateral ankle with inversion, pain improved with smaller ROM Ankle inversion/eversion towel slide Ankle pronation with manual assistance Seated ball squeeze between heels STS x 10 without UE support Tandem walking fwd/bkwd  with counter support   PATIENT EDUCATION:  Education details: PT POC and goals, HEP Person educated: Patient Education method: Explanation, Demonstration, and Handouts Education comprehension: verbalized understanding and returned demonstration  HOME EXERCISE PROGRAM: Access Code: [email or web address] URL: [email or web address] Date: 07/30/2023 Prepared by: Reggy Eye  Exercises - Ankle Inversion Eversion Towel Slide  - 1 x daily - 7 x weekly - 3 sets - 10 reps - Seated Plantar Fascia Mobilization with Small Ball  - 1 x daily - 7 x weekly - 1 sets - 3 reps - 1-2 minutes hold - Gastroc Stretch on Wall  - 1 x daily - 7 x weekly - 1 sets - 3 reps - 20-30 sec hold - Sit to Stand  - 1 x daily - 7 x weekly - 2 sets - 10 reps - Seated Ankle Inversion Eversion PROM  - 1 x daily - 7 x weekly - 3 sets - 10 reps - Heel Raise on Step  - 1 x daily - 7 x weekly - 3 sets - 10 reps - Standing Bilateral Heel Raise on Step  - 1 x daily - 7 x weekly - 3 sets - 10 reps  ASSESSMENT:  CLINICAL IMPRESSION: Pt has decreased pain and is progressing tolerance to balance and strengthening exercises. He will continue to benefit from skilled PT to progress strength and balance and improve ankle stability  OBJECTIVE IMPAIRMENTS: decreased activity tolerance, decreased balance, difficulty walking, decreased ROM, decreased strength, and pain.     GOALS: Goals reviewed with patient? Yes  SHORT TERM GOALS: Target date: 08/12/2023   Pt will be independent in initial HEP Baseline: Goal status: MET  2.  Pt will report standing or walking x 15 minutes with pain < = 3/10 Baseline: 8/10 Goal status: IN PROGRESS - 4/10 on 08/13/23    LONG TERM GOALS: Target date: 09/09/2023   Pt will be independent with advanced HEP Baseline:  Goal status: INITIAL  2.  Pt will demo increased leg strength by perform 5x STS <= 11 seconds Baseline:  Goal status: INITIAL  3.  Pt will stand or walk x 15 minutes  with pain <= 1/10 Baseline:  Goal status: INITIAL  4.  Pt will improve Rt ankle strength to 4/5 to improve standing and walking tolerance Baseline:  Goal status: INITIAL    PLAN:  PT FREQUENCY: 1-2x/week  PT DURATION: 8 weeks  PLANNED INTERVENTIONS: 97164- PT Re-evaluation, 97110-Therapeutic exercises, 97530- Therapeutic activity, [id number]- Neuromuscular re-education, 97535- Self Care, [id number]- Manual therapy, [id number]- Aquatic Therapy, Patient/Family education, Balance training, Taping, Dry Needling, Cryotherapy, and Moist heat  PLAN FOR NEXT SESSION:   Ankle stability/balance exercises, ankle strength   Martise Waddell, PT 08/13/2023, 3:32 PM

## 2023-09-01 DIAGNOSIS — G20C Parkinsonism, unspecified: Secondary | ICD-10-CM | POA: Diagnosis not present

## 2023-09-01 DIAGNOSIS — Z79899 Other long term (current) drug therapy: Secondary | ICD-10-CM | POA: Diagnosis not present

## 2023-09-03 ENCOUNTER — Ambulatory Visit (INDEPENDENT_AMBULATORY_CARE_PROVIDER_SITE_OTHER): Payer: Medicare HMO

## 2023-09-03 VITALS — Ht 68.0 in | Wt 180.0 lb

## 2023-09-03 DIAGNOSIS — Z0000 Encounter for general adult medical examination without abnormal findings: Secondary | ICD-10-CM

## 2023-09-03 NOTE — Progress Notes (Signed)
 Subjective:   Marc Reynolds is a 73 y.o. male who presents for Medicare Annual/Subsequent preventive examination.  Visit Complete: Virtual I connected with  Sabra Heck on 09/03/23 by a audio enabled telemedicine application and verified that I am speaking with the correct person using two identifiers.  Patient Location: Home  Provider Location: Office/Clinic  I discussed the limitations of evaluation and management by telemedicine. The patient expressed understanding and agreed to proceed.  Vital Signs: Because this visit was a virtual/telehealth visit, some criteria may be missing or patient reported. Any vitals not documented were not able to be obtained and vitals that have been documented are patient reported.  Patient Medicare AWV questionnaire was completed by the patient on n/a; I have confirmed that all information answered by patient is correct and no changes since this date.  Cardiac Risk Factors include: advanced age (>31men, >33 women);male gender;family history of premature cardiovascular disease     Objective:    Today's Vitals   09/03/23 0957  Weight: 180 lb (81.6 kg)  Height: 5\' 8"  (1.727 m)  PainSc: 3    Body mass index is 27.37 kg/m.     09/03/2023   10:09 AM 07/15/2023    1:55 PM 10/18/2022   10:25 AM 09/02/2022    9:54 AM 12/04/2020    9:51 AM 12/14/2019   10:15 AM  Advanced Directives  Does Patient Have a Medical Advance Directive? Yes Yes Yes Yes Yes No  Type of Estate agent of [place];Living will Healthcare Power of [place];Living will Healthcare Power of [place];Living will Living will;Healthcare Power of [employer] Power of [place];Living will   Does patient want to make changes to medical advance directive? No - Patient declined   No - Patient declined No - Patient declined   Copy of Healthcare Power of Attorney in Chart? No - copy requested   No - copy requested No - copy requested   Would patient like information on  creating a medical advance directive?      Yes (ED - Information included in AVS)    Current Medications (verified) Outpatient Encounter Medications as of 09/03/2023  Medication Sig   carbidopa-levodopa (SINEMET IR) 25-100 MG tablet Take by mouth 3 (three) times daily.   Multiple Vitamins-Minerals (MULTIVITAMIN PO) Take by mouth.   Omega-3 Fatty Acids (FISH OIL OMEGA-3 PO) Take by mouth.   pramipexole (MIRAPEX) 1 MG tablet Take 1 mg by mouth 3 (three) times daily.   Probiotic Product (PROBIOTIC PO) Take by mouth.   [DISCONTINUED] meloxicam (MOBIC) 15 MG tablet ONE TAB BY MOUTH EVERY 24 HOURS WITH A MEAL FOR 2 WEEKS, THEN ONCE EVERY 24 HOURS AS NEEDED FOR PAIN   [DISCONTINUED] methylPREDNISolone (MEDROL DOSEPAK) 4 MG TBPK tablet Take as directed   [DISCONTINUED] SHINGRIX injection    [DISCONTINUED] UNABLE TO FIND    No facility-administered encounter medications on file as of 09/03/2023.    Allergies (verified) Patient has no known allergies.   History: Past Medical History:  Diagnosis Date   Anxiety    Cancer [facility])    Prostate 2022   Depression    Neuromuscular disorder (HCC)    Parkinson's 2021   Varicose veins of legs    History reviewed. No pertinent surgical history. Family History  Problem Relation Age of Onset   Pancreatic cancer Mother    Hypertension Mother    Diabetes Mother    Cancer Mother    Hypertension Father    Alcohol abuse Father  Arthritis Father    Varicose Veins Father    Skin cancer Sister    Hypertension Brother    Cancer Maternal Grandmother    ADD / ADHD Son    ADD / ADHD Son    Learning disabilities Son    Birth defects Daughter    Intellectual disability Daughter    Learning disabilities Daughter    Obesity Daughter    Hypertension Brother    Social History   Socioeconomic History   Marital status: Married    Spouse name: June   Number of children: 3   Years of education: 16   Highest education level: Manufacturing engineer (e.g., MA,  MS, MEng, MEd, MSW, MBA)  Occupational History   Occupation: Retired  Tobacco Use   Smoking status: Never   Smokeless tobacco: Never  Vaping Use   Vaping status: Never Used  Substance and Sexual Activity   Alcohol use: Yes    Alcohol/week: 2.0 standard drinks of alcohol    Types: 1 Glasses of wine, 1 Cans of beer per week    Comment: Drink occasionally.   Drug use: Never   Sexual activity: Not Currently    Partners: Female    Birth control/protection: None  Other Topics Concern   Not on file  Social History Narrative   Lives with his wife and daughter (she has down syndrome). He enjoys being outdoors.    Social Drivers of Corporate investment banker Strain: Low Risk  (09/03/2023)   Overall Financial Resource Strain (CARDIA)    Difficulty of Paying Living Expenses: Not hard at all  Food Insecurity: No Food Insecurity (09/03/2023)   Hunger Vital Sign    Worried About Running Out of Food in the Last Year: Never true    Ran Out of Food in the Last Year: Never true  Transportation Needs: No Transportation Needs (09/03/2023)   PRAPARE - Administrator, Civil Service (Medical): No    Lack of Transportation (Non-Medical): No  Physical Activity: Sufficiently Active (09/03/2023)   Exercise Vital Sign    Days of Exercise per Week: 5 days    Minutes of Exercise per Session: 30 min  Stress: Stress Concern Present (09/03/2023)   [employer] of Occupational Health - Occupational Stress Questionnaire    Feeling of Stress : To some extent  Social Connections: Socially Integrated (09/03/2023)   Social Connection and Isolation Panel [NHANES]    Frequency of Communication with Friends and Family: More than three times a week    Frequency of Social Gatherings with Friends and Family: Twice a week    Attends Religious Services: More than 4 times per year    Active Member of [employer] or Organizations: Yes    Attends Engineer, structural: More than 4 times per year     Marital Status: Married    Tobacco Counseling Counseling given: Not Answered   Clinical Intake:  Pre-visit preparation completed: Yes  Pain : 0-10 Pain Score: 3  Pain Type: Chronic pain Pain Location: Foot Pain Orientation: Right Pain Descriptors / Indicators: Aching Pain Onset: More than a month ago Pain Frequency: Intermittent Pain Relieving Factors: rest  Pain Relieving Factors: rest  BMI - recorded: 27.37 Nutritional Status: BMI 25 -29 Overweight Nutritional Risks: None Diabetes: No  How often do you need to have someone help you when you read instructions, pamphlets, or other written materials from your doctor or pharmacy?: 1 - Never What is the last grade level you completed in  school?: 18  Interpreter Needed?: No      Activities of Daily Living    09/03/2023   10:01 AM 09/02/2023    1:08 PM  In your present state of health, do you have any difficulty performing the following activities:  Hearing? 0 0  Vision? 0 0  Difficulty concentrating or making decisions? 1 1  Walking or climbing stairs? 0 0  Dressing or bathing? 0 0  Doing errands, shopping? 0 0  Preparing Food and eating ? N N  Using the Toilet? N N  In the past six months, have you accidently leaked urine? Y Y  Do you have problems with loss of bowel control? N N  Managing your Medications? N N  Managing your Finances? N   Housekeeping or managing your Housekeeping? N N    Patient Care Team: Everrett Coombe, DO as PCP - General (Family Medicine)  Indicate any recent Medical Services you may have received from other than [facility] providers in the past year (date may be approximate).     Assessment:   This is a routine wellness examination for Rohn.  Hearing/Vision screen No results found.   Goals Addressed             This Visit's Progress    Activity and Exercise Increased       He would like to be more active and eat healthy.       Depression Screen    09/03/2023   10:07 AM  09/02/2022    9:52 AM 08/29/2021    9:06 AM 02/28/2021    4:01 PM 12/14/2019   10:14 AM 04/24/2016    1:06 PM 09/08/2015    9:13 AM  PHQ 2/9 Scores  PHQ - 2 Score 0 0 0 2 2 0 0  PHQ- 9 Score   4  8      Fall Risk    09/03/2023   10:09 AM 09/02/2023    1:08 PM 09/02/2022    9:52 AM 08/26/2022    2:02 PM 08/29/2021    9:05 AM  Fall Risk   Falls in the past year? 1 1 0 0 1  Number falls in past yr: 0 0 0  0  Injury with Fall? 1 1 0  0  Risk for fall due to : Impaired balance/gait  Impaired balance/gait  History of fall(s)  Follow up Falls evaluation completed  Falls evaluation completed  Falls evaluation completed    MEDICARE RISK AT HOME: Medicare Risk at Home Any stairs in or around the home?: Yes If so, are there any without handrails?: Yes Home free of loose throw rugs in walkways, pet beds, electrical cords, etc?: Yes Adequate lighting in your home to reduce risk of falls?: Yes Life alert?: No Use of a cane, walker or w/c?: No Grab bars in the bathroom?: No Shower chair or bench in shower?: No Elevated toilet seat or a handicapped toilet?: No  TIMED UP AND GO:  Was the test performed?  No    Cognitive Function:        09/03/2023   10:10 AM 09/02/2022    9:56 AM 08/29/2021    9:12 AM  6CIT Screen  What Year? 0 points 0 points 0 points  What month? 0 points 0 points 0 points  What time? 0 points 0 points 0 points  Count back from 20 0 points 0 points 0 points  Months in reverse 0 points 0 points 2 points  Repeat phrase  0 points 0 points 0 points  Total Score 0 points 0 points 2 points    Immunizations Immunization History  Administered Date(s) Administered   Moderna Sars-Covid-2 Vaccination 01/13/2020, 02/10/2020   Pneumococcal Conjugate-13 12/14/2019   Tdap 09/08/2015, 10/09/2022   Zoster Recombinant(Shingrix) 08/08/2023    TDAP status: Up to date  Flu Vaccine status: Due, Education has been provided regarding the importance of this vaccine. Advised may  receive this vaccine at local pharmacy or Health Dept. Aware to provide a copy of the vaccination record if obtained from local pharmacy or Health Dept. Verbalized acceptance and understanding.  Pneumococcal vaccine status: Due, Education has been provided regarding the importance of this vaccine. Advised may receive this vaccine at local pharmacy or Health Dept. Aware to provide a copy of the vaccination record if obtained from local pharmacy or Health Dept. Verbalized acceptance and understanding.  Covid-19 vaccine status: Completed vaccines  Qualifies for Shingles Vaccine? Yes   Zostavax completed No   Shingrix Completed?: Yes  Screening Tests Health Maintenance  Topic Date Due   Hepatitis C Screening  Never done   COVID-19 Vaccine (3 - Moderna risk series) 03/09/2020   Pneumonia Vaccine 9+ Years old (2 of 2 - PPSV23 or PCV20) 12/13/2020   INFLUENZA VACCINE  Never done   Zoster Vaccines- Shingrix (2 of 2) 10/03/2023   Medicare Annual Wellness (AWV)  09/02/2024   Fecal DNA (Cologuard)  04/06/2026   DTaP/Tdap/Td (3 - Td or Tdap) 10/08/2032   HPV VACCINES  Aged Out    Health Maintenance  Health Maintenance Due  Topic Date Due   Hepatitis C Screening  Never done   COVID-19 Vaccine (3 - Moderna risk series) 03/09/2020   Pneumonia Vaccine 63+ Years old (2 of 2 - PPSV23 or PCV20) 12/13/2020   INFLUENZA VACCINE  Never done    Colorectal cancer screening: Type of screening: Cologuard. Completed 04/07/2023. Repeat every 3 years  Lung Cancer Screening: (Low Dose CT Chest recommended if Age 44-80 years, 20 pack-year currently smoking OR have quit w/in 15years.) does not qualify.   Lung Cancer Screening Referral: n/a  Additional Screening:  Hepatitis C Screening: does qualify; Completed not yet  Vision Screening: Recommended annual ophthalmology exams for early detection of glaucoma and other disorders of the eye. Is the patient up to date with their annual eye exam?  Yes  Who is  the provider or what is the name of the office in which the patient attends annual eye exams? [facility] If pt is not established with a provider, would they like to be referred to a provider to establish care?  N/a .   Dental Screening: Recommended annual dental exams for proper oral hygiene    Community Resource Referral / Chronic Care Management: CRR required this visit?  No   CCM required this visit?  No     Plan:     I have personally reviewed and noted the following in the patient's chart:   Medical and social history Use of alcohol, tobacco or illicit drugs  Current medications and supplements including opioid prescriptions. Patient is not currently taking opioid prescriptions. Functional ability and status Nutritional status Physical activity Advanced directives List of other physicians Hospitalizations, surgeries, and ER visits in previous 12 months Vitals Screenings to include cognitive, depression, and falls Referrals and appointments  In addition, I have reviewed and discussed with patient certain preventive protocols, quality metrics, and best practice recommendations. A written personalized care plan for preventive services as well as  general preventive health recommendations were provided to patient.     Esmond Harps, CMA   09/03/2023   After Visit Summary: (MyChart) Due to this being a telephonic visit, the after visit summary with patients personalized plan was offered to patient via MyChart   Nurse Notes:   Kaleab Frasier is a 73 y.o. male patient of Everrett Coombe, DO who had a Medicare Annual Wellness Visit today via telephone. Normand is Retired and lives with their family. he has 3 children. He reports that he is socially active and does interact with friends/family regularly. He is moderately physically active and enjoys being outdoors.

## 2023-09-03 NOTE — Patient Instructions (Signed)
  Marc Reynolds , Thank you for taking time to come for your Medicare Wellness Visit. I appreciate your ongoing commitment to your health goals. Please review the following plan we discussed and let me know if I can assist you in the future.   These are the goals we discussed:  Goals       Activity and Exercise Increased      He would like to be more active and eat healthy.       Patient Stated (pt-stated)      08/29/2021 AWV Goal: Improved Nutrition/Diet  Patient will verbalize understanding that diet plays an important role in overall health and that a poor diet is a risk factor for many chronic medical conditions.  Over the next year, patient will improve self management of their diet by incorporating more water. Patient will utilize available community resources to help with food acquisition if needed (ex: food pantries, [address], etc) Patient will work with nutrition specialist if a referral was made       Patient Stated (pt-stated)      Patient stated that he would like to loose 15 lbs.        This is a list of the screening recommended for you and due dates:  Health Maintenance  Topic Date Due   Hepatitis C Screening  Never done   COVID-19 Vaccine (3 - Moderna risk series) 03/09/2020   Pneumonia Vaccine (2 of 2 - PPSV23 or PCV20) 12/13/2020   Flu Shot  09/15/2023*   Medicare Annual Wellness Visit  09/02/2024   Cologuard (Stool DNA test)  04/06/2026   DTaP/Tdap/Td vaccine (3 - Td or Tdap) 10/08/2032   Zoster (Shingles) Vaccine  Completed   HPV Vaccine  Aged Out  *Topic was postponed. The date shown is not the original due date.

## 2023-09-09 ENCOUNTER — Encounter: Payer: Self-pay | Admitting: Physical Therapy

## 2023-09-09 ENCOUNTER — Ambulatory Visit: Attending: Podiatry | Admitting: Physical Therapy

## 2023-09-09 DIAGNOSIS — M6281 Muscle weakness (generalized): Secondary | ICD-10-CM | POA: Diagnosis not present

## 2023-09-09 DIAGNOSIS — M25571 Pain in right ankle and joints of right foot: Secondary | ICD-10-CM | POA: Diagnosis not present

## 2023-09-09 NOTE — Therapy (Signed)
 OUTPATIENT PHYSICAL THERAPY LOWER EXTREMITY TREATMENT   Patient Name: Marc Reynolds MRN: [id number] DOB:Aug 12, 1950, 73 y.o., male Today's Date: 09/09/2023  END OF SESSION:  PT End of Session - 09/09/23 1440     Visit Number 5    Number of Visits 16    Date for PT Re-Evaluation 09/09/23    Authorization Type UHC Medicare    Authorization Time Period 16 approved through 09/09/23    Authorization - Visit Number 5    Progress Note Due on Visit 10    PT Start Time 1400    PT Stop Time 1440    PT Time Calculation (min) 40 min    Activity Tolerance Patient tolerated treatment well    Behavior During Therapy [facility] for tasks assessed/performed                 Past Medical History:  Diagnosis Date   Anxiety    Cancer (HCC)    Prostate 2022   Depression    Neuromuscular disorder (HCC)    Parkinson's 2021   Varicose veins of legs    History reviewed. No pertinent surgical history. Patient Active Problem List   Diagnosis Date Noted   Left foot pain 11/07/2022   Visit for suture removal 10/15/2022   Primary osteoarthritis of left knee 08/30/2022   Malignant neoplasm of prostate (HCC) 12/04/2020   Well adult exam 12/14/2019   Resting tremor 12/14/2019   Parkinsonism (HCC) 05/01/2018   Hypertriglyceridemia 09/11/2015   Leukocytosis 09/11/2015   Anxiety and depression 08/11/2015   BPH (benign prostatic hyperplasia) 08/11/2015    PCP: Everrett Coombe  REFERRING PROVIDER: Ralene Cork  REFERRING DIAG: peroneal tendonitis  THERAPY DIAG:  Pain in right ankle and joints of right foot  Muscle weakness (generalized)  Rationale for Evaluation and Treatment: Rehabilitation  ONSET DATE: 04/2023  SUBJECTIVE:   SUBJECTIVE STATEMENT: Pt states he still feels like his ankle is "turning in " when he is on uneven surfaces. He has been wearing an ankle brace and states this feels like it helps  PERTINENT HISTORY: Parkinson's Disease  Pt states he was walking a lot this fall and he  developed Rt ankle pain. The pain is in the ball of the foot and the top of the foot. Pt states with prolonged standing he feels like the bottom of his foot is "on fire". Pain resolves when not weight bearing. Pt states the MD gave him medicine and that his pain is "better than it was" but he still feels his ankles are "weak". Pt also complains of decreased LE strength and difficulty getting up and down from floor PAIN:  Are you having pain? Yes: NPRS scale: 3/10 at rest, 4/10 at worst Pain location: ball of Rt foot Pain description: burning/sharp Aggravating factors: standing or walking for prolonged time Relieving factors: rest, meds  PRECAUTIONS: Fall  RED FLAGS: None   WEIGHT BEARING RESTRICTIONS: No  FALLS:  Has patient fallen in last 6 months? No   OCCUPATION: Retired  PLOF: Independent  PATIENT GOALS: reduce pain  NEXT MD VISIT: PRN  OBJECTIVE:  Note: Objective measures were completed at Evaluation unless otherwise noted.  DIAGNOSTIC FINDINGS: Rt ankle x ray:  No acute fractures or dislocations noted.  Some mild degnerative changes noted in right ankle and subtalar joint    POSTURE: rounded shoulders and forward head  PALPATION: Mild TTP Rt plantar fascia, Rt lateral malleolus  LOWER EXTREMITY ROM:  Active ROM Right eval Left eval Right 3/25  Hip flexion  Hip extension     Hip abduction     Hip adduction     Hip internal rotation     Hip external rotation     Knee flexion     Knee extension     Ankle dorsiflexion 1 5 2   Ankle plantarflexion 65 62 65  Ankle inversion 25 35 25  Ankle eversion 15 25 22    (Blank rows = not tested)  LOWER EXTREMITY MMT:  MMT Right eval Left eval Right 3/25  Hip flexion     Hip extension     Hip abduction     Hip adduction     Hip internal rotation     Hip external rotation     Knee flexion     Knee extension     Ankle dorsiflexion 4 4 4   Ankle plantarflexion 4- 4 4  Ankle inversion 4 4 4   Ankle eversion 3+  4 3 pain   (Blank rows = not tested)    FUNCTIONAL TESTS:  No pain with SLS (with light UE support) bilat 5 x STS: 13.91  No change in foot/ankle symptoms with lumbar flexion/ext                                                                                                                               [facility] Adult PT Treatment:                                                DATE: 09/09/23 Therapeutic Exercise/Activity: 5x STS: 14.98 seconds Strength and ROM measurements (see above) Ankle eversion red TB x 15 Ankle inversion ball squeze x 15 SLS without ankle brace 2 x 30 sec intermittent UE support Attempted heel raises - limited by pain  Self Care: Education on continued PT POC and goals  [facility] Adult PT Treatment:                                                DATE: 08/13/23 Therapeutic Exercise/Activity/NMR: Nustep L6 x 5 min Seated rocker board A/P for ankle PF/DF ROM x 1 min bilat Seated ankle pronation on dynadisc Heel raises on step x 12 Heel raise with ball between heels x 12 SLS arch unsupported with 1 UE support x 30 sec bilat - only able to hold x 20 sec on Rt ankle SLS on foam with 1 UE support 2 x 30 sec bilat Sit <> stand 2 x 5 Discussed floor transfer technique and ideas to make floor transfer more safe and efficient  Manual Therapy: TC joint mobs grade 2-3 bilat Ankle PROM   [facility] Adult PT Treatment:  DATE: 07/30/23 Therapeutic Exercise/Activity: Nustep L6 x 5 min for warm up Ankle pronation with manual assistance to reduce supination in ankle at rest Seated rocker board for DF/PF ROM BAPS with assistance inv/ev, DF/PF, circles Seated self ankle PROM Rocker board lateral wt shift x 1 min Heel raises off step x 10 Sit <> stand x 10 Manual Therapy: TC joint mobs grade 2-3 Rt Ankle PROM   [facility] Adult PT Treatment:                                                DATE: 07/23/23 Therapeutic Exercise/Therapeutic  Activity/ NMR: Nustep L6 x 5 min for warm up Heel/toe raises x 20 Standing on airex: narrow BOS, narrow BOS with head turns - CGA/min A, tandem stance - CGA BAPS with assistance inv/ev, DF/PF - some pain in lateral ankle with inversion, pain improved with smaller ROM Ankle inversion/eversion towel slide Ankle pronation with manual assistance Seated ball squeeze between heels STS x 10 without UE support Tandem walking fwd/bkwd with counter support   PATIENT EDUCATION:  Education details: PT POC and goals, HEP Person educated: Patient Education method: Programmer, multimedia, Demonstration, and Handouts Education comprehension: verbalized understanding and returned demonstration  HOME EXERCISE PROGRAM: Access Code: [email or web address] URL: [email or web address] Date: 09/09/2023 Prepared by: Reggy Eye  Exercises - Gastroc Stretch on Wall  - 1 x daily - 7 x weekly - 1 sets - 3 reps - 20-30 sec hold - Sit to Stand  - 1 x daily - 7 x weekly - 2 sets - 10 reps - Seated Ankle Eversion with Resistance  - 1 x daily - 7 x weekly - 3 sets - 10 reps - Isometric Ankle Inversion  - 1 x daily - 7 x weekly - 3 sets - 10 reps  ASSESSMENT:  CLINICAL IMPRESSION: Pt continues with deficits in ankle strength and ROM and will benefit from continued PT to address deficits. HEP updated to include a more manageable number of exercises. Pt educated on progress towards goals and continued POC  OBJECTIVE IMPAIRMENTS: decreased activity tolerance, decreased balance, difficulty walking, decreased ROM, decreased strength, and pain.     GOALS: Goals reviewed with patient? Yes  SHORT TERM GOALS: Target date: 08/12/2023   Pt will be independent in initial HEP Baseline: Goal status: MET  2.  Pt will report standing or walking x 15 minutes with pain < = 3/10 Baseline: 8/10 Goal status: IN PROGRESS - 4/10 on 08/13/23    LONG TERM GOALS: Target date: 09/09/2023   Pt will be independent with advanced  HEP Baseline:  Goal status: IN PROGRESS  2.  Pt will demo increased leg strength by perform 5x STS <= 11 seconds Baseline:  Goal status: IN PROGRESS  3.  Pt will stand or walk x 15 minutes with pain <= 1/10 Baseline:  Goal status: IN PROGRESS - 4/10 on 3/25  4.  Pt will improve Rt ankle strength to 4/5 to improve standing and walking tolerance Baseline:  Goal status: IN PROGRESS    PLAN:  PT FREQUENCY: 1-2x/week  PT DURATION: 8 weeks  PLANNED INTERVENTIONS: [id number]- PT Re-evaluation, 97110-Therapeutic exercises, 97530- Therapeutic activity, [id number]- Neuromuscular re-education, 97535- Self Care, [id number]- Manual therapy, [id number]- Aquatic Therapy, Patient/Family education, Balance training, Taping, Dry Needling, Cryotherapy, and Moist heat  PLAN FOR NEXT SESSION:   re- eval for parkinson's/balance  Chasya Zenz, PT 09/09/2023, 2:41 PM

## 2023-09-17 ENCOUNTER — Encounter: Payer: Self-pay | Admitting: Rehabilitative and Restorative Service Providers"

## 2023-09-19 ENCOUNTER — Ambulatory Visit: Attending: Podiatry | Admitting: Rehabilitative and Restorative Service Providers"

## 2023-09-19 ENCOUNTER — Encounter: Payer: Self-pay | Admitting: Rehabilitative and Restorative Service Providers"

## 2023-09-19 ENCOUNTER — Other Ambulatory Visit: Payer: Self-pay

## 2023-09-19 DIAGNOSIS — R2689 Other abnormalities of gait and mobility: Secondary | ICD-10-CM | POA: Insufficient documentation

## 2023-09-19 DIAGNOSIS — M25571 Pain in right ankle and joints of right foot: Secondary | ICD-10-CM | POA: Insufficient documentation

## 2023-09-19 DIAGNOSIS — R29818 Other symptoms and signs involving the nervous system: Secondary | ICD-10-CM | POA: Diagnosis not present

## 2023-09-19 DIAGNOSIS — R2681 Unsteadiness on feet: Secondary | ICD-10-CM | POA: Diagnosis not present

## 2023-09-19 DIAGNOSIS — M6281 Muscle weakness (generalized): Secondary | ICD-10-CM | POA: Diagnosis not present

## 2023-09-19 NOTE — Therapy (Signed)
 OUTPATIENT PHYSICAL THERAPY EVALUATION   Patient Name: Marc Reynolds MRN: [id number] DOB:1951-02-21, 73 y.o., male Today's Date: 09/19/2023  END OF SESSION:  PT End of Session - 09/19/23 1102     Visit Number 1    Number of Visits 16    Date for PT Re-Evaluation 11/18/23    Authorization Type [employer] Medicare    Progress Note Due on Visit 10    PT Start Time 1104    PT Stop Time 1145    PT Time Calculation (min) 41 min    Activity Tolerance Patient tolerated treatment well    Behavior During Therapy [facility] for tasks assessed/performed            Past Medical History:  Diagnosis Date   Anxiety    Cancer (HCC)    Prostate 2022   Depression    Neuromuscular disorder (HCC)    Parkinson's 2021   Varicose veins of legs    History reviewed. No pertinent surgical history. Patient Active Problem List   Diagnosis Date Noted   Left foot pain 11/07/2022   Visit for suture removal 10/15/2022   Primary osteoarthritis of left knee 08/30/2022   Malignant neoplasm of prostate (HCC) 12/04/2020   Well adult exam 12/14/2019   Resting tremor 12/14/2019   Parkinsonism (HCC) 05/01/2018   Hypertriglyceridemia 09/11/2015   Leukocytosis 09/11/2015   Anxiety and depression 08/11/2015   BPH (benign prostatic hyperplasia) 08/11/2015   PCP: Everrett Coombe REFERRING PROVIDER: Kateri Mc, MD  REFERRING DIAG: peroneal tendonitis G20.C (ICD-10-CM) - Parkinsonism, unspecified  THERAPY DIAG:  Other abnormalities of gait and mobility  Unsteadiness on feet  Other symptoms and signs involving the nervous system  Rationale for Evaluation and Treatment: Rehabilitation  ONSET DATE: 09/01/23  SUBJECTIVE:   SUBJECTIVE STATEMENT: The patient reports his feet still hurt and he continues with foot turning in (walking in inversion and "it pulls on my ankle").  The bilateral foot issue is fairly new and he notes pain on both sides. Pt states he still feels like his ankle is "turning in "  when he is on uneven surfaces. He has been wearing an ankle brace and states this helps. For Parkinsonism, he notes tremors. He has not had a fall in one year.  Underwent med changes with neurologist in the last week. He is on levadopa 2mg  3x/day  PERTINENT HISTORY: Parkinson's Disease, prostate cancer, anxiety, depression  PAIN:  Are you having pain? Yes: NPRS scale: 4/10 at worst Pain location: ball of left foot, pain on the side/top of the R foot. Pain description: burning/sharp Aggravating factors: standing or walking for prolonged time Relieving factors: rest, meds  *kept him awake last night and ibuprofen helped  PRECAUTIONS: Fall  WEIGHT BEARING RESTRICTIONS: No  FALLS:  Has patient fallen in last 6 months? No  PATIENT GOALS: reduce pain  OBJECTIVE:  Note: Objective measures were completed at Evaluation unless otherwise noted. COGNITION: Overall cognitive status: Within functional limits for tasks assessed   COORDINATION: Overall diminished coordination   MUSCLE LENGTH: Hamstrings: tight hamstrings noted bilaterally with MMT knee extension  POSTURE:  weight shifted more to L LE with narrow base of support and mild ankle inversion R  LOWER EXTREMITY ROM:    WFLs  LOWER EXTREMITY MMT:   MMT Right Eval Left Eval  Hip flexion 5/5 5/5  Hip extension    Hip abduction    Hip adduction    Hip internal rotation    Hip external rotation  Knee flexion 5/5 5/5  Knee extension 5/5 5/5  Ankle dorsiflexion 3+/5 4/5  Ankle plantarflexion    Ankle inversion    Ankle eversion    (Blank rows = not tested)  BED MOBILITY:  independent  TRANSFERS: Assistive device utilized: None  Sit to stand: Complete Independence Stand to sit: Complete Independence  STAIRS: Comments: TBA  GAIT: Gait pattern: decreased arm swing- Right, decreased arm swing- Left, decreased stance time- Right, decreased stride length, decreased trunk rotation, and poor foot clearance-  Right Distance walked: 138ft Assistive device utilized: None Level of assistance: Modified independence Comments: gait speed=2.16 ft/sec   [facility] PT Assessment - 09/19/23 1117       Balance   Balance Assessed Yes      Standardized Balance Assessment   Standardized Balance Assessment Berg Balance Test;Timed Up and Go Test;Mini-BESTest;Five Times Sit to [employer] Test   Sit to Stand Able to stand without using hands and stabilize independently    Standing Unsupported Able to stand safely 2 minutes    Sitting with Back Unsupported but Feet Supported on Floor or Stool Able to sit safely and securely 2 minutes    Stand to Sit Sits safely with minimal use of hands    Transfers Able to transfer safely, minor use of hands    Standing Unsupported with Eyes Closed Able to stand 10 seconds safely    Standing Unsupported with Feet Together Able to place feet together independently and stand 1 minute safely    From Standing, Reach Forward with Outstretched Arm Can reach confidently >25 cm (10")    From Standing Position, Pick up Object from Floor Able to pick up shoe safely and easily    From Standing Position, Turn to Look Behind Over each Shoulder Looks behind from both sides and weight shifts well    Turn 360 Degrees Able to turn 360 degrees safely but slowly   8 steps to complete a turn   Standing Unsupported, Alternately Place Feet on Step/Stool Able to stand independently and safely and complete 8 steps in 20 seconds    Standing Unsupported, One Foot in Front Needs help to step but can hold 15 seconds    Standing on One Leg Tries to lift leg/unable to hold 3 seconds but remains standing independently    Total Score 48    Berg comment: 48/56      Mini-BESTest   Sit To Stand Normal: Comes to stand without use of hands and stabilizes independently.    Rise to Toes Moderate: Heels up, but not full range (smaller than when holding hands), OR noticeable instability for 3 s.     Stand on one leg (left) Moderate: < 20 s    Stand on one leg (right) Severe: Unable    Stand on one leg - lowest score 0    Compensatory Stepping Correction - Forward Normal: Recovers independently with a single, large step (second realignement is allowed).    Compensatory Stepping Correction - Backward No step, OR would fall if not caught, OR falls spontaneously.    Compensatory Stepping Correction - Left Lateral Moderate: Several steps to recover equilibrium    Compensatory Stepping Correction - Right Lateral Moderate: Several steps to recover equilibrium    Stepping Corredtion Lateral - lowest score 1    Stance - Feet together, eyes open, firm surface  Normal: 30s    Stance - Feet together, eyes closed, foam surface  Moderate: < 30s  Incline - Eyes Closed Severe: Unable    Change in Gait Speed Moderate: Unable to change walking speed or signs of imbalance    Walk with head turns - Horizontal Moderate: performs head turns with reduction in gait speed.    Walk with pivot turns Moderate:Turns with feet close SLOW (>4 steps) with good balance.    Step over obstacles Moderate: Steps over box but touches box OR displays cautious behavior by slowing gait.    Timed UP & GO with Dual Task Normal: No noticeable change in sitting, standing or walking while backward counting when compared to TUG without   11.25 seconds   Mini-BEST total score 15      Timed Up and Go Test   TUG Comments 9.96 seconds             FUNCTIONAL TESTS:  5 times sit to stand: 11.83 seconds Timed up and go (TUG): 9.96 seconds Berg Balance Scale: 48/56 Mini Best Test: 15/28  [facility] Adult PT Treatment:                                                DATE: 09/19/23 Therapeutic Activity: PWR! Up exercises working on wide base of support PWR! Twist exercise PWR! Transition sit<>stand  PATIENT EDUCATION: Education details: HEP Person educated: Patient Education method: Programmer, multimedia, Facilities manager, and  Handouts Education comprehension: verbalized understanding, returned demonstration, and needs further education  HOME EXERCISE PROGRAM:    GOALS: Goals reviewed with patient? Yes  SHORT TERM GOALS: Target date: 10/18/23  The patient will be indep with initial HEP Baseline: initiated at eval Goal status: INITIAL  2.  The patient will report pain in bilateral feet < or equal to 2/10. Baseline:  4/10 Goal status: INITIAL  LONG TERM GOALS: Target date: 11/18/23  The patient will be indep with progression of HEP. Baseline:  initiated at eval Goal status: INITIAL  2.  The patient will improve miniBEST to > or equal to 19/28. Baseline: 15/28 Goal status: INITIAL  3.  The patient will improve gait speed to > or equal to 2.6 ft/sec Baseline: 2.16 ft/sec Goal status: INITIAL  4.  The patient will verbalize understanding of strategies to reduce freezing episodes during gait. Baseline:  R foot heavy and pt reports he cannot lift it Goal status: INITIAL  5.  The patient will maintain single limb standing on R side x 8 seconds. Baseline:  2 seconds Goal status: INITIAL  6.  The patient will be indep with post d/c PWR exercise and have list of community resources. Baseline:  discussed at eval Goal status: INITIAL  ASSESSMENT:  CLINICAL IMPRESSION: Patient is a 73 y.o. male who was seen today for physical therapy evaluation and treatment for parkinsonism. Daouda is known to our clinic from recent treatment for R foot pain. PT to address balance, coordination, gait abnormalities, while also considering impact of neurologic deficits on foot pain--- he does have narrow base with R ankle rolling and also notes R foot is the one that freezes during gait.PT to address deficits to improve functional mobility, maintain independence, reduce risk for falls, and promote continued exercise after d/c.    OBJECTIVE IMPAIRMENTS: Abnormal gait, decreased activity tolerance, decreased balance, decreased  coordination, difficulty walking, impaired flexibility, and pain.   ACTIVITY LIMITATIONS: bending, squatting, and locomotion level  PARTICIPATION LIMITATIONS: community activity and yard  work  PERSONAL FACTORS: 3+ comorbidities: parkinsonism, h/o bilat foot pain, h/o depression  are also affecting patient's functional outcome.   REHAB POTENTIAL: Good  CLINICAL DECISION MAKING: Evolving/moderate complexity  EVALUATION COMPLEXITY: Moderate  PLAN:  PT FREQUENCY: 2x/week  PT DURATION: 8 weeks  PLANNED INTERVENTIONS: 97164- PT Re-evaluation, 97110-Therapeutic exercises, 97530- Therapeutic activity, 97112- Neuromuscular re-education, 97535- Self Care, 96045- Manual therapy, [phone]- Gait training, Patient/Family education, Balance training, Taping, and DME instructions  PLAN FOR NEXT SESSION: PWR! Exercises considering R foot placement, work on tips to reduce freezing with gait, toe isolated movements (to review prior there ex as indicated), balance, gait  Ason Heslin, PT 09/19/2023, 1:52 PM   Date of referral: 09/01/23 Referring provider: Kateri Mc, MD Referring diagnosis? Parkinsonism Treatment diagnosis? (if different than referring diagnosis)  Other abnormalities of gait and mobility Unsteadiness on feet Other symptoms and signs involving the nervous system  What was this (referring dx) caused by? Ongoing Issue  Stephen Dawes of Condition: Initial Onset (within last 3 months)   Laterality: Both  Current Functional Measure Score: Other mini BEST 15/28  Objective measurements identify impairments when they are compared to normal values, the uninvolved extremity, and prior level of function.  x Yes  []  No  Objective assessment of functional ability: Moderate functional limitations   Briefly describe symptoms: Parkinsonism with abnormality of gait, freezing of gait (festination), dyscoordination, foot pain  How did symptoms start: ongoing  Average pain  intensity:  Last 24 hours: 4/10  Past week: 4/10  How often does the pt experience symptoms? Frequently  How much have the symptoms interfered with usual daily activities? Moderately  How has condition changed since care began at this facility? A little worse  In general, how is the patients overall health? Fair   BACK PAIN (STarT Back Screening Tool) No

## 2023-09-24 ENCOUNTER — Ambulatory Visit: Admitting: Physical Therapy

## 2023-09-24 ENCOUNTER — Encounter: Payer: Self-pay | Admitting: Physical Therapy

## 2023-09-24 DIAGNOSIS — R2689 Other abnormalities of gait and mobility: Secondary | ICD-10-CM

## 2023-09-24 DIAGNOSIS — M6281 Muscle weakness (generalized): Secondary | ICD-10-CM | POA: Diagnosis not present

## 2023-09-24 DIAGNOSIS — R2681 Unsteadiness on feet: Secondary | ICD-10-CM | POA: Diagnosis not present

## 2023-09-24 DIAGNOSIS — R29818 Other symptoms and signs involving the nervous system: Secondary | ICD-10-CM | POA: Diagnosis not present

## 2023-09-24 DIAGNOSIS — M25571 Pain in right ankle and joints of right foot: Secondary | ICD-10-CM | POA: Diagnosis not present

## 2023-09-24 NOTE — Therapy (Signed)
 OUTPATIENT PHYSICAL THERAPY TREATMENT  Patient Name: Marc Reynolds MRN: [id number] DOB:1950-11-17, 73 y.o., male Today's Date: 09/24/2023  END OF SESSION:  PT End of Session - 09/24/23 1443     Visit Number 2    Number of Visits 16    Date for PT Re-Evaluation 11/18/23    Authorization Type [employer] Medicare    Authorization - Visit Number 2    Progress Note Due on Visit 10    PT Start Time 1403    PT Stop Time 1444    PT Time Calculation (min) 41 min    Activity Tolerance Patient tolerated treatment well    Behavior During Therapy [facility] for tasks assessed/performed             Past Medical History:  Diagnosis Date   Anxiety    Cancer (HCC)    Prostate 2022   Depression    Neuromuscular disorder (HCC)    Parkinson's 2021   Varicose veins of legs    History reviewed. No pertinent surgical history. Patient Active Problem List   Diagnosis Date Noted   Left foot pain 11/07/2022   Visit for suture removal 10/15/2022   Primary osteoarthritis of left knee 08/30/2022   Malignant neoplasm of prostate (HCC) 12/04/2020   Well adult exam 12/14/2019   Resting tremor 12/14/2019   Parkinsonism (HCC) 05/01/2018   Hypertriglyceridemia 09/11/2015   Leukocytosis 09/11/2015   Anxiety and depression 08/11/2015   BPH (benign prostatic hyperplasia) 08/11/2015   PCP: Everrett Coombe REFERRING PROVIDER: Kateri Mc, MD  REFERRING DIAG: peroneal tendonitis G20.C (ICD-10-CM) - Parkinsonism, unspecified  THERAPY DIAG:  Other abnormalities of gait and mobility  Unsteadiness on feet  Rationale for Evaluation and Treatment: Rehabilitation  ONSET DATE: 09/01/23  SUBJECTIVE:   SUBJECTIVE STATEMENT: Pt states his ankle continues to be his major complaint. He is developing pain in his [name] foot as well as the ongoing pain in Rt foot and ankle PERTINENT HISTORY: Parkinson's Disease, prostate cancer, anxiety, depression  The patient reports his feet still hurt and he continues  with foot turning in (walking in inversion and "it pulls on my ankle").  The bilateral foot issue is fairly new and he notes pain on both sides. Pt states he still feels like his ankle is "turning in " when he is on uneven surfaces. He has been wearing an ankle brace and states this helps. For Parkinsonism, he notes tremors. He has not had a fall in one year.  Underwent med changes with neurologist in the last week. He is on levadopa 2mg  3x/day  PAIN:  Are you having pain? Yes: NPRS scale: 4/10 at worst Pain location: ball of left foot, pain on the side/top of the R foot. Pain description: burning/sharp Aggravating factors: standing or walking for prolonged time Relieving factors: rest, meds  *kept him awake last night and ibuprofen helped  PRECAUTIONS: Fall  WEIGHT BEARING RESTRICTIONS: No  FALLS:  Has patient fallen in last 6 months? No  PATIENT GOALS: reduce pain  OBJECTIVE:  Note: Objective measures were completed at Evaluation unless otherwise noted. COGNITION: Overall cognitive status: Within functional limits for tasks assessed   COORDINATION: Overall diminished coordination   MUSCLE LENGTH: Hamstrings: tight hamstrings noted bilaterally with MMT knee extension  POSTURE:  weight shifted more to L LE with narrow base of support and mild ankle inversion R  LOWER EXTREMITY ROM:    WFLs  LOWER EXTREMITY MMT:   MMT Right Eval Left Eval  Hip flexion 5/5  5/5  Hip extension    Hip abduction    Hip adduction    Hip internal rotation    Hip external rotation    Knee flexion 5/5 5/5  Knee extension 5/5 5/5  Ankle dorsiflexion 3+/5 4/5  Ankle plantarflexion    Ankle inversion    Ankle eversion    (Blank rows = not tested)  BED MOBILITY:  independent  TRANSFERS: Assistive device utilized: None  Sit to stand: Complete Independence Stand to sit: Complete Independence  STAIRS: Comments: TBA  GAIT: Gait pattern: decreased arm swing- Right, decreased arm swing-  Left, decreased stance time- Right, decreased stride length, decreased trunk rotation, and poor foot clearance- Right Distance walked: 136ft Assistive device utilized: None Level of assistance: Modified independence Comments: gait speed=2.16 ft/sec     FUNCTIONAL TESTS:  5 times sit to stand: 11.83 seconds Timed up and go (TUG): 9.96 seconds Berg Balance Scale: 48/56 Mini Best Test: 15/28  [facility] Adult PT Treatment:                                                DATE: 09/24/23 Therapeutic Exercise/Activity/NMR: [place]! Up PWR! Step forward and laterally - pain in Rt ankle with forward step Rocker board laterally with focus on improving ankle mobility Standing with L LE on 12'' step for focus on Rt ankle stability - requires 2 UE support Standing on Rt LE while rolling foam roll with [name] LE with 1 UE support - cuing for flat foot to reduce Rt ankle supination PWR! Step forward with 1 UE support placing Rt foot on dynadisc with pt reporting decreased pain in Rt ankle on compliant surface Semi tandem stance on foam - pt requires UE support with Rt LE in back due to decreased ankle strength and stability Side step, backwards walking with focus on balance and Rt ankle stability Standing with towel roll under Rt lateral foot to improve neutral ankle position - lateral step and fwd/bkwd step with [name] LE with focus on maintaining neutral ankle on Rt   [facility] Adult PT Treatment:                                                DATE: 09/19/23 Therapeutic Activity: PWR! Up exercises working on wide base of support PWR! Twist exercise PWR! Transition sit<>stand  PATIENT EDUCATION: Education details: HEP Person educated: Patient Education method: Programmer, multimedia, Facilities manager, and Handouts Education comprehension: verbalized understanding, returned demonstration, and needs further education  HOME EXERCISE PROGRAM:    GOALS: Goals reviewed with patient? Yes  SHORT TERM GOALS: Target date:  10/18/23  The patient will be indep with initial HEP Baseline: initiated at eval Goal status: INITIAL  2.  The patient will report pain in bilateral feet < or equal to 2/10. Baseline:  4/10 Goal status: INITIAL  LONG TERM GOALS: Target date: 11/18/23  The patient will be indep with progression of HEP. Baseline:  initiated at eval Goal status: INITIAL  2.  The patient will improve miniBEST to > or equal to 19/28. Baseline: 15/28 Goal status: INITIAL  3.  The patient will improve gait speed to > or equal to 2.6 ft/sec Baseline: 2.16 ft/sec Goal status: INITIAL  4.  The patient  will verbalize understanding of strategies to reduce freezing episodes during gait. Baseline:  R foot heavy and pt reports he cannot lift it Goal status: INITIAL  5.  The patient will maintain single limb standing on R side x 8 seconds. Baseline:  2 seconds Goal status: INITIAL  6.  The patient will be indep with post d/c PWR exercise and have list of community resources. Baseline:  discussed at eval Goal status: INITIAL  ASSESSMENT:  CLINICAL IMPRESSION: Continued to work on balance and ankle stability/proprioception. Pt with significant Rt ankle supination especially when fatigued. Neutral ankle position improved with use of towel roll but is difficult to maintain when towel roll removed  OBJECTIVE IMPAIRMENTS: Abnormal gait, decreased activity tolerance, decreased balance, decreased coordination, difficulty walking, impaired flexibility, and pain.    PLAN:  PT FREQUENCY: 2x/week  PT DURATION: 8 weeks  PLANNED INTERVENTIONS: 97164- PT Re-evaluation, 97110-Therapeutic exercises, 97530- Therapeutic activity, [id number]- Neuromuscular re-education, 97535- Self Care, [id number]- Manual therapy, [phone]- Gait training, Patient/Family education, Balance training, Taping, and DME instructions  PLAN FOR NEXT SESSION: PWR! Exercises considering R foot placement, work on tips to reduce freezing with gait, toe isolated  movements (to review prior there ex as indicated), balance, gait  Venera Privott, PT 09/24/2023, 2:44 PM

## 2023-09-30 ENCOUNTER — Encounter: Payer: Self-pay | Admitting: Physical Therapy

## 2023-09-30 ENCOUNTER — Ambulatory Visit: Admitting: Physical Therapy

## 2023-09-30 DIAGNOSIS — M25571 Pain in right ankle and joints of right foot: Secondary | ICD-10-CM | POA: Diagnosis not present

## 2023-09-30 DIAGNOSIS — R2681 Unsteadiness on feet: Secondary | ICD-10-CM | POA: Diagnosis not present

## 2023-09-30 DIAGNOSIS — M6281 Muscle weakness (generalized): Secondary | ICD-10-CM | POA: Diagnosis not present

## 2023-09-30 DIAGNOSIS — R29818 Other symptoms and signs involving the nervous system: Secondary | ICD-10-CM | POA: Diagnosis not present

## 2023-09-30 DIAGNOSIS — R2689 Other abnormalities of gait and mobility: Secondary | ICD-10-CM

## 2023-09-30 NOTE — Therapy (Signed)
 OUTPATIENT PHYSICAL THERAPY TREATMENT  Patient Name: Marc Reynolds MRN: [id number] DOB:05-20-1951, 73 y.o., male Today's Date: 09/30/2023  END OF SESSION:  PT End of Session - 09/30/23 1527     Visit Number 3    Number of Visits 16    Date for PT Re-Evaluation 11/18/23    Authorization Type [employer] Medicare    Authorization Time Period 16 approved through 09/09/23    Authorization - Visit Number 3    Progress Note Due on Visit 10    PT Start Time 1445    PT Stop Time 1528    PT Time Calculation (min) 43 min    Activity Tolerance Patient tolerated treatment well    Behavior During Therapy [facility] for tasks assessed/performed              Past Medical History:  Diagnosis Date   Anxiety    Cancer (HCC)    Prostate 2022   Depression    Neuromuscular disorder (HCC)    Parkinson's 2021   Varicose veins of legs    History reviewed. No pertinent surgical history. Patient Active Problem List   Diagnosis Date Noted   Left foot pain 11/07/2022   Visit for suture removal 10/15/2022   Primary osteoarthritis of left knee 08/30/2022   Malignant neoplasm of prostate (HCC) 12/04/2020   Well adult exam 12/14/2019   Resting tremor 12/14/2019   Parkinsonism (HCC) 05/01/2018   Hypertriglyceridemia 09/11/2015   Leukocytosis 09/11/2015   Anxiety and depression 08/11/2015   BPH (benign prostatic hyperplasia) 08/11/2015   PCP: Everrett Coombe REFERRING PROVIDER: Kateri Mc, MD  REFERRING DIAG: peroneal tendonitis G20.C (ICD-10-CM) - Parkinsonism, unspecified  THERAPY DIAG:  Other abnormalities of gait and mobility  Unsteadiness on feet  Rationale for Evaluation and Treatment: Rehabilitation  ONSET DATE: 09/01/23  SUBJECTIVE:   SUBJECTIVE STATEMENT: Pt states he changed his shoes and he feels this new pair give him more support and reduce supination  PERTINENT HISTORY: Parkinson's Disease, prostate cancer, anxiety, depression  The patient reports his feet still  hurt and he continues with foot turning in (walking in inversion and "it pulls on my ankle").  The bilateral foot issue is fairly new and he notes pain on both sides. Pt states he still feels like his ankle is "turning in " when he is on uneven surfaces. He has been wearing an ankle brace and states this helps. For Parkinsonism, he notes tremors. He has not had a fall in one year.  Underwent med changes with neurologist in the last week. He is on levadopa 2mg  3x/day  PAIN:  Are you having pain? Yes: NPRS scale: 4/10 at worst Pain location: ball of left foot, pain on the side/top of the R foot. Pain description: burning/sharp Aggravating factors: standing or walking for prolonged time Relieving factors: rest, meds  *kept him awake last night and ibuprofen helped  PRECAUTIONS: Fall  WEIGHT BEARING RESTRICTIONS: No  FALLS:  Has patient fallen in last 6 months? No  PATIENT GOALS: reduce pain  OBJECTIVE:  Note: Objective measures were completed at Evaluation unless otherwise noted. COGNITION: Overall cognitive status: Within functional limits for tasks assessed   COORDINATION: Overall diminished coordination   MUSCLE LENGTH: Hamstrings: tight hamstrings noted bilaterally with MMT knee extension  POSTURE:  weight shifted more to L LE with narrow base of support and mild ankle inversion R  LOWER EXTREMITY ROM:    WFLs  LOWER EXTREMITY MMT:   MMT Right Eval Left Eval  Hip  flexion 5/5 5/5  Hip extension    Hip abduction    Hip adduction    Hip internal rotation    Hip external rotation    Knee flexion 5/5 5/5  Knee extension 5/5 5/5  Ankle dorsiflexion 3+/5 4/5  Ankle plantarflexion    Ankle inversion    Ankle eversion    (Blank rows = not tested)  BED MOBILITY:  independent  TRANSFERS: Assistive device utilized: None  Sit to stand: Complete Independence Stand to sit: Complete Independence  STAIRS: Comments: TBA  GAIT: Gait pattern: decreased arm swing- Right,  decreased arm swing- Left, decreased stance time- Right, decreased stride length, decreased trunk rotation, and poor foot clearance- Right Distance walked: 158ft Assistive device utilized: None Level of assistance: Modified independence Comments: gait speed=2.16 ft/sec     FUNCTIONAL TESTS:  5 times sit to stand: 11.83 seconds Timed up and go (TUG): 9.96 seconds Berg Balance Scale: 48/56 Mini Best Test: 15/28  [facility] Adult PT Treatment:                                                DATE: 09/30/23 Therapeutic Exercise/Activity/NMR: Grant Lay! Step forward, laterally, diagonal - improved neutral ankle position noted today Tandem stance on foam - pt requires UE support with Rt LE in back due to decreased ankle strength and stability Tandem stance on ground with pt better able to control Rt ankle position Standing with towel roll under Rt lateral foot to improve neutral ankle position - mini squats, PWR! Up  Gait forward and backward focus on improving neutral ankle position    [facility] Adult PT Treatment:                                                DATE: 09/24/23 Therapeutic Exercise/Activity/NMR: Grant Lay! Up PWR! Step forward and laterally - pain in Rt ankle with forward step Rocker board laterally with focus on improving ankle mobility Standing with L LE on 12'' step for focus on Rt ankle stability - requires 2 UE support Standing on Rt LE while rolling foam roll with Lt LE with 1 UE support - cuing for flat foot to reduce Rt ankle supination PWR! Step forward with 1 UE support placing Rt foot on dynadisc with pt reporting decreased pain in Rt ankle on compliant surface Semi tandem stance on foam - pt requires UE support with Rt LE in back due to decreased ankle strength and stability Side step, backwards walking with focus on balance and Rt ankle stability Standing with towel roll under Rt lateral foot to improve neutral ankle position - lateral step and fwd/bkwd step with Lt LE with focus on  maintaining neutral ankle on Rt   [facility] Adult PT Treatment:                                                DATE: 09/19/23 Therapeutic Activity: PWR! Up exercises working on wide base of support PWR! Twist exercise PWR! Transition sit<>stand  PATIENT EDUCATION: Education details: HEP Person educated: Patient Education method: Explanation, Facilities manager, and Handouts Education comprehension: verbalized understanding,  returned demonstration, and needs further education  HOME EXERCISE PROGRAM:    GOALS: Goals reviewed with patient? Yes  SHORT TERM GOALS: Target date: 10/18/23  The patient will be indep with initial HEP Baseline: initiated at eval Goal status: INITIAL  2.  The patient will report pain in bilateral feet < or equal to 2/10. Baseline:  4/10 Goal status: INITIAL  LONG TERM GOALS: Target date: 11/18/23  The patient will be indep with progression of HEP. Baseline:  initiated at eval Goal status: INITIAL  2.  The patient will improve miniBEST to > or equal to 19/28. Baseline: 15/28 Goal status: INITIAL  3.  The patient will improve gait speed to > or equal to 2.6 ft/sec Baseline: 2.16 ft/sec Goal status: INITIAL  4.  The patient will verbalize understanding of strategies to reduce freezing episodes during gait. Baseline:  R foot heavy and pt reports he cannot lift it Goal status: INITIAL  5.  The patient will maintain single limb standing on R side x 8 seconds. Baseline:  2 seconds Goal status: INITIAL  6.  The patient will be indep with post d/c PWR exercise and have list of community resources. Baseline:  discussed at eval Goal status: INITIAL  ASSESSMENT:  CLINICAL IMPRESSION: Pt with improving ankle endurance. He is able to maintain neutral ankle for longer during session today. Continued to progress balance and PWR! Moves as well as ankle stability.  OBJECTIVE IMPAIRMENTS: Abnormal gait, decreased activity tolerance, decreased balance, decreased  coordination, difficulty walking, impaired flexibility, and pain.    PLAN:  PT FREQUENCY: 2x/week  PT DURATION: 8 weeks  PLANNED INTERVENTIONS: 97164- PT Re-evaluation, 97110-Therapeutic exercises, 97530- Therapeutic activity, [id number]- Neuromuscular re-education, 97535- Self Care, [id number]- Manual therapy, [phone]- Gait training, Patient/Family education, Balance training, Taping, and DME instructions  PLAN FOR NEXT SESSION: PWR! Exercises considering R foot placement, work on tips to reduce freezing with gait, toe isolated movements (to review prior there ex as indicated), balance, gait  Alyss Granato, PT 09/30/2023, 3:28 PM

## 2023-10-08 ENCOUNTER — Encounter: Payer: Self-pay | Admitting: Physical Therapy

## 2023-10-08 ENCOUNTER — Ambulatory Visit: Admitting: Physical Therapy

## 2023-10-08 DIAGNOSIS — R2681 Unsteadiness on feet: Secondary | ICD-10-CM

## 2023-10-08 DIAGNOSIS — R29818 Other symptoms and signs involving the nervous system: Secondary | ICD-10-CM | POA: Diagnosis not present

## 2023-10-08 DIAGNOSIS — M6281 Muscle weakness (generalized): Secondary | ICD-10-CM | POA: Diagnosis not present

## 2023-10-08 DIAGNOSIS — R2689 Other abnormalities of gait and mobility: Secondary | ICD-10-CM | POA: Diagnosis not present

## 2023-10-08 DIAGNOSIS — M25571 Pain in right ankle and joints of right foot: Secondary | ICD-10-CM | POA: Diagnosis not present

## 2023-10-08 NOTE — Therapy (Signed)
 OUTPATIENT PHYSICAL THERAPY TREATMENT  Patient Name: Marc Reynolds MRN: [id number] DOB:[birth year], 73 y.o., male Today's Date: 10/08/2023  END OF SESSION:  PT End of Session - 10/08/23 1530     Visit Number 4    Number of Visits 16    Date for PT Re-Evaluation 11/18/23    Authorization Type [employer] Medicare    Authorization Time Period 16 approved through 10/18/23    Authorization - Visit Number 4    Progress Note Due on Visit 10    PT Start Time 1445    PT Stop Time 1525    PT Time Calculation (min) 40 min    Activity Tolerance Patient tolerated treatment well    Behavior During Therapy [facility] for tasks assessed/performed               Past Medical History:  Diagnosis Date   Anxiety    Cancer (HCC)    Prostate 2022   Depression    Neuromuscular disorder (HCC)    Parkinson's 2021   Varicose veins of legs    History reviewed. No pertinent surgical history. Patient Active Problem List   Diagnosis Date Noted   Left foot pain 11/07/2022   Visit for suture removal 10/15/2022   Primary osteoarthritis of left knee 08/30/2022   Malignant neoplasm of prostate (HCC) 12/04/2020   Well adult exam 12/14/2019   Resting tremor 12/14/2019   Parkinsonism (HCC) 05/01/2018   Hypertriglyceridemia 09/11/2015   Leukocytosis 09/11/2015   Anxiety and depression 08/11/2015   BPH (benign prostatic hyperplasia) 08/11/2015   PCP: Adela Holter REFERRING PROVIDER: Carlton Chick, MD  REFERRING DIAG: peroneal tendonitis G20.C (ICD-10-CM) - Parkinsonism, unspecified  THERAPY DIAG:  Other abnormalities of gait and mobility  Unsteadiness on feet  Rationale for Evaluation and Treatment: Rehabilitation  ONSET DATE: 09/01/23  SUBJECTIVE:   SUBJECTIVE STATEMENT: Pt states he 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 He states his brace and shoes are helping his ankle turn in less.  PERTINENT HISTORY: Parkinson's Disease, prostate cancer, anxiety, depression  The  patient reports his feet still hurt and he continues with foot turning in (walking in inversion and "it pulls on my ankle").  The bilateral foot issue is fairly new and he notes pain on both sides. Pt states he still feels like his ankle is "turning in " when he is on uneven surfaces. He has been wearing an ankle brace and states this helps. For Parkinsonism, he notes tremors. He has not had a fall in one year.  Underwent med changes with neurologist in the last week. He is on levadopa 2mg  3x/day  PAIN:  Are you having pain? Yes: NPRS scale: 4/10 at worst Pain location: ball of left foot, pain on the side/top of the R foot. Pain description: burning/sharp Aggravating factors: standing or walking for prolonged time Relieving factors: rest, meds  *kept him awake last night and ibuprofen helped  PRECAUTIONS: Fall  WEIGHT BEARING RESTRICTIONS: No  FALLS:  Has patient fallen in last 6 months? No  PATIENT GOALS: reduce pain  OBJECTIVE:  Note: Objective measures were completed at Evaluation unless otherwise noted. COGNITION: Overall cognitive status: Within functional limits for tasks assessed   COORDINATION: Overall diminished coordination   MUSCLE LENGTH: Hamstrings: tight hamstrings noted bilaterally with MMT knee extension  POSTURE:  weight shifted more to L LE with narrow base of support and mild ankle inversion R  LOWER EXTREMITY ROM:    WFLs  LOWER EXTREMITY MMT:  MMT Right Eval Left Eval  Hip flexion 5/5 5/5  Hip extension    Hip abduction    Hip adduction    Hip internal rotation    Hip external rotation    Knee flexion 5/5 5/5  Knee extension 5/5 5/5  Ankle dorsiflexion 3+/5 4/5  Ankle plantarflexion    Ankle inversion    Ankle eversion    (Blank rows = not tested)  BED MOBILITY:  independent  TRANSFERS: Assistive device utilized: None  Sit to stand: Complete Independence Stand to sit: Complete Independence  STAIRS: Comments: TBA  GAIT: Gait  pattern: decreased arm swing- Right, decreased arm swing- Left, decreased stance time- Right, decreased stride length, decreased trunk rotation, and poor foot clearance- Right Distance walked: 120ft Assistive device utilized: None Level of assistance: Modified independence Comments: gait speed=2.16 ft/sec     FUNCTIONAL TESTS:  5 times sit to stand: 11.83 seconds Timed up and go (TUG): 9.96 seconds Berg Balance Scale: 48/56 Mini Best Test: 15/28  [facility] Adult PT Treatment:                                                DATE: 10/08/23 Therapeutic Exercise/Activity/NMR: Grant Lay! Up with step forward Stepping and wt shifting to various colored dots with focus on keeping Rt ankle neutral Standing on foam with wide BOS with tap up with Lt LE focus on Rt ankle neutral Standing on foam with wt shift focus on Rt ankle neutral Gait fwd/bkwd/sideways all focus on neutral ankle and improving step and stride length PWR! Step all directions   [facility] Adult PT Treatment:                                                DATE: 09/30/23 Therapeutic Exercise/Activity/NMR: Grant Lay! Step forward, laterally, diagonal - improved neutral ankle position noted today Tandem stance on foam - pt requires UE support with Rt LE in back due to decreased ankle strength and stability Tandem stance on ground with pt better able to control Rt ankle position Standing with towel roll under Rt lateral foot to improve neutral ankle position - mini squats, PWR! Up  Gait forward and backward focus on improving neutral ankle position    [facility] Adult PT Treatment:                                                DATE: 09/24/23 Therapeutic Exercise/Activity/NMR: Grant Lay! Up PWR! Step forward and laterally - pain in Rt ankle with forward step Rocker board laterally with focus on improving ankle mobility Standing with L LE on 12'' step for focus on Rt ankle stability - requires 2 UE support Standing on Rt LE while rolling foam roll with Lt LE with  1 UE support - cuing for flat foot to reduce Rt ankle supination PWR! Step forward with 1 UE support placing Rt foot on dynadisc with pt reporting decreased pain in Rt ankle on compliant surface Semi tandem stance on foam - pt requires UE support with Rt LE in back due to decreased ankle strength and stability Side step, backwards walking  with focus on balance and Rt ankle stability Standing with towel roll under Rt lateral foot to improve neutral ankle position - lateral step and fwd/bkwd step with Lt LE with focus on maintaining neutral ankle on Rt   PATIENT EDUCATION: Education details: HEP Person educated: Patient Education method: Programmer, multimedia, Facilities manager, and Handouts Education comprehension: verbalized understanding, returned demonstration, and needs further education  HOME EXERCISE PROGRAM:    GOALS: Goals reviewed with patient? Yes  SHORT TERM GOALS: Target date: 10/18/23  The patient will be indep with initial HEP Baseline: initiated at eval Goal status: INITIAL  2.  The patient will report pain in bilateral feet < or equal to 2/10. Baseline:  4/10 Goal status: INITIAL  LONG TERM GOALS: Target date: 11/18/23  The patient will be indep with progression of HEP. Baseline:  initiated at eval Goal status: INITIAL  2.  The patient will improve miniBEST to > or equal to 19/28. Baseline: 15/28 Goal status: INITIAL  3.  The patient will improve gait speed to > or equal to 2.6 ft/sec Baseline: 2.16 ft/sec Goal status: INITIAL  4.  The patient will verbalize understanding of strategies to reduce freezing episodes during gait. Baseline:  R foot heavy and pt reports he cannot lift it Goal status: INITIAL  5.  The patient will maintain single limb standing on R side x 8 seconds. Baseline:  2 seconds Goal status: INITIAL  6.  The patient will be indep with post d/c PWR exercise and have list of community resources. Baseline:  discussed at eval Goal status:  INITIAL  ASSESSMENT:  CLINICAL IMPRESSION: Pt forgot noon meds today so required increased time due to freezing with some motions. Discussed pt returning to the gym to work on overall LE strength. Focus of treatment today on Rt ankle neutral during stepping and gait. Pt initially with good ankle placement but fatigues quickly  OBJECTIVE IMPAIRMENTS: Abnormal gait, decreased activity tolerance, decreased balance, decreased coordination, difficulty walking, impaired flexibility, and pain.    PLAN:  PT FREQUENCY: 2x/week  PT DURATION: 8 weeks  PLANNED INTERVENTIONS: 97164- PT Re-evaluation, 97110-Therapeutic exercises, 97530- Therapeutic activity, [id number]- Neuromuscular re-education, 97535- Self Care, [id number]- Manual therapy, [phone]- Gait training, Patient/Family education, Balance training, Taping, and DME instructions  PLAN FOR NEXT SESSION: PWR! Exercises considering R foot placement, work on tips to reduce freezing with gait, toe isolated movements (to review prior there ex as indicated), balance, gait  Chelise Hanger, PT 10/08/2023, 3:31 PM

## 2023-10-10 ENCOUNTER — Ambulatory Visit: Admitting: Rehabilitative and Restorative Service Providers"

## 2023-10-10 DIAGNOSIS — R29818 Other symptoms and signs involving the nervous system: Secondary | ICD-10-CM

## 2023-10-10 DIAGNOSIS — M6281 Muscle weakness (generalized): Secondary | ICD-10-CM

## 2023-10-10 DIAGNOSIS — R2689 Other abnormalities of gait and mobility: Secondary | ICD-10-CM | POA: Diagnosis not present

## 2023-10-10 DIAGNOSIS — R2681 Unsteadiness on feet: Secondary | ICD-10-CM

## 2023-10-10 DIAGNOSIS — M25571 Pain in right ankle and joints of right foot: Secondary | ICD-10-CM | POA: Diagnosis not present

## 2023-10-10 NOTE — Therapy (Signed)
 OUTPATIENT PHYSICAL THERAPY TREATMENT  Patient Name: Marc Reynolds MRN: [id number] DOB:1951-04-08, 73 y.o., male Today's Date: 10/10/2023  END OF SESSION:  PT End of Session - 10/10/23 1105     Visit Number 5    Number of Visits 16    Date for PT Re-Evaluation 11/18/23    Authorization Type [employer] Medicare    Authorization Time Period 16 approved through 10/18/23    Progress Note Due on Visit 10    PT Start Time 1105    PT Stop Time 1145    PT Time Calculation (min) 40 min    Activity Tolerance Patient tolerated treatment well    Behavior During Therapy [facility] for tasks assessed/performed             Past Medical History:  Diagnosis Date   Anxiety    Cancer (HCC)    Prostate 2022   Depression    Neuromuscular disorder (HCC)    Parkinson's 2021   Varicose veins of legs    No past surgical history on file. Patient Active Problem List   Diagnosis Date Noted   Left foot pain 11/07/2022   Visit for suture removal 10/15/2022   Primary osteoarthritis of left knee 08/30/2022   Malignant neoplasm of prostate (HCC) 12/04/2020   Well adult exam 12/14/2019   Resting tremor 12/14/2019   Parkinsonism (HCC) 05/01/2018   Hypertriglyceridemia 09/11/2015   Leukocytosis 09/11/2015   Anxiety and depression 08/11/2015   BPH (benign prostatic hyperplasia) 08/11/2015   PCP: Adela Holter REFERRING PROVIDER: Carlton Chick, MD  REFERRING DIAG: peroneal tendonitis G20.C (ICD-10-CM) - Parkinsonism, unspecified  THERAPY DIAG:  Other abnormalities of gait and mobility  Unsteadiness on feet  Pain in right ankle and joints of right foot  Other symptoms and signs involving the nervous system  Muscle weakness (generalized)  Rationale for Evaluation and Treatment: Rehabilitation  ONSET DATE: 09/01/23  SUBJECTIVE:   SUBJECTIVE STATEMENT: Pt is using newer shoes due to old ones worn leading to rolling foot out.  PERTINENT HISTORY: Parkinson's Disease, prostate cancer,  anxiety, depression  The patient reports his feet still hurt and he continues with foot turning in (walking in inversion and "it pulls on my ankle").  The bilateral foot issue is fairly new and he notes pain on both sides. Pt states he still feels like his ankle is "turning in " when he is on uneven surfaces. He has been wearing an ankle brace and states this helps. For Parkinsonism, he notes tremors. He has not had a fall in one year.  Underwent med changes with neurologist in the last week. He is on levadopa 2mg  3x/day  PAIN:  Are you having pain? Yes: NPRS scale: 4/10 at worst Pain location: ball of left foot, pain on the side/top of the R foot. Pain description: burning/sharp Aggravating factors: standing or walking for prolonged time Relieving factors: rest, meds  *kept him awake last night and ibuprofen helped  PRECAUTIONS: Fall  WEIGHT BEARING RESTRICTIONS: No  FALLS:  Has patient fallen in last 6 months? No  PATIENT GOALS: reduce pain  OBJECTIVE:  Note: Objective measures were completed at Evaluation unless otherwise noted. COGNITION: Overall cognitive status: Within functional limits for tasks assessed   COORDINATION: Overall diminished coordination   MUSCLE LENGTH: Hamstrings: tight hamstrings noted bilaterally with MMT knee extension  POSTURE:  weight shifted more to L LE with narrow base of support and mild ankle inversion R  LOWER EXTREMITY ROM:    WFLs  LOWER EXTREMITY  MMT:   MMT Right Eval Left Eval  Hip flexion 5/5 5/5  Hip extension    Hip abduction    Hip adduction    Hip internal rotation    Hip external rotation    Knee flexion 5/5 5/5  Knee extension 5/5 5/5  Ankle dorsiflexion 3+/5 4/5  Ankle plantarflexion    Ankle inversion    Ankle eversion    (Blank rows = not tested)  BED MOBILITY:  independent  TRANSFERS: Assistive device utilized: None  Sit to stand: Complete Independence Stand to sit: Complete  Independence  STAIRS: Comments: TBA  GAIT: Gait pattern: decreased arm swing- Right, decreased arm swing- Left, decreased stance time- Right, decreased stride length, decreased trunk rotation, and poor foot clearance- Right Distance walked: 132ft Assistive device utilized: None Level of assistance: Modified independence Comments: gait speed=2.16 ft/sec  FUNCTIONAL TESTS:  5 times sit to stand: 11.83 seconds Timed up and go (TUG): 9.96 seconds Berg Balance Scale: 48/56 Mini Best Test: 15/28  [facility] Adult PT Treatment:                                                DATE: 10/10/23 Neuromuscular re-ed: Standing PWR! Moves Rock and reach x 10 reps Twist x 10 reps with cues to hit middle "T" Transition sit<>stand with verbal and demo cues Therapeutic Activity: Supine PWR! Moves Large amplitude stepping x 10 reps Twist x 10 reps Weight shift x 5 reps Prone PWR! Moves Min A to move floor<>stand with UE support and tactile cues for motor planning + demo PWR! Up and reaching for weight shift [employer] ant/posterior Attempted PWR up to tall kneel but unable, therefore used physioball to move into tall kneel and weight shift R and L opening (start of PWR twist moves) Gait: Verbal cues for "reach with your heels" and adding tactile cues for arm swing that patient is able to carryover with repetition X 240 ft     [facility] Adult PT Treatment:                                                DATE: 10/08/23 Therapeutic Exercise/Activity/NMR: Grant Lay! Up with step forward Stepping and wt shifting to various colored dots with focus on keeping Rt ankle neutral Standing on foam with wide BOS with tap up with Lt LE focus on Rt ankle neutral Standing on foam with wt shift focus on Rt ankle neutral Gait fwd/bkwd/sideways all focus on neutral ankle and improving step and stride length PWR! Step all directions   [facility] Adult PT Treatment:                                                DATE:  09/30/23 Therapeutic Exercise/Activity/NMR: Grant Lay! Step forward, laterally, diagonal - improved neutral ankle position noted today Tandem stance on foam - pt requires UE support with Rt LE in back due to decreased ankle strength and stability Tandem stance on ground with pt better able to control Rt ankle position Standing with towel roll under Rt lateral foot to improve neutral ankle position - mini  squats, PWR! Up  Gait forward and backward focus on improving neutral ankle position    [facility] Adult PT Treatment:                                                DATE: 09/24/23 Therapeutic Exercise/Activity/NMR: Grant Lay! Up PWR! Step forward and laterally - pain in Rt ankle with forward step Rocker board laterally with focus on improving ankle mobility Standing with L LE on 12'' step for focus on Rt ankle stability - requires 2 UE support Standing on Rt LE while rolling foam roll with Lt LE with 1 UE support - cuing for flat foot to reduce Rt ankle supination PWR! Step forward with 1 UE support placing Rt foot on dynadisc with pt reporting decreased pain in Rt ankle on compliant surface Semi tandem stance on foam - pt requires UE support with Rt LE in back due to decreased ankle strength and stability Side step, backwards walking with focus on balance and Rt ankle stability Standing with towel roll under Rt lateral foot to improve neutral ankle position - lateral step and fwd/bkwd step with Lt LE with focus on maintaining neutral ankle on Rt   PATIENT EDUCATION: Education details: HEP Person educated: Patient Education method: Programmer, multimedia, Facilities manager, and Handouts Education comprehension: verbalized understanding, returned demonstration, and needs further education  HOME EXERCISE PROGRAM:    GOALS: Goals reviewed with patient? Yes  SHORT TERM GOALS: Target date: 10/18/23  The patient will be indep with initial HEP Baseline: initiated at eval Goal status: INITIAL  2.  The patient will  report pain in bilateral feet < or equal to 2/10. Baseline:  4/10 Goal status: INITIAL  LONG TERM GOALS: Target date: 11/18/23  The patient will be indep with progression of HEP. Baseline:  initiated at eval Goal status: INITIAL  2.  The patient will improve miniBEST to > or equal to 19/28. Baseline: 15/28 Goal status: INITIAL  3.  The patient will improve gait speed to > or equal to 2.6 ft/sec Baseline: 2.16 ft/sec Goal status: INITIAL  4.  The patient will verbalize understanding of strategies to reduce freezing episodes during gait. Baseline:  R foot heavy and pt reports he cannot lift it Goal status: INITIAL  5.  The patient will maintain single limb standing on R side x 8 seconds. Baseline:  2 seconds Goal status: INITIAL  6.  The patient will be indep with post d/c PWR exercise and have list of community resources. Baseline:  discussed at eval Goal status: INITIAL  ASSESSMENT:  CLINICAL IMPRESSION: The patient and PT worked on some PWR moves on the floor (on yoga mat) with assist to get into position. He notes difficulty getting in/out of bed and is sleeping in his recliner. We discussed blocked practice during day of bed mobility to improve functional ability. PT plans to add more HEP-- potentially supine in the bed for increased practice with bed mobility (if patient willing to do at home).  OBJECTIVE IMPAIRMENTS: Abnormal gait, decreased activity tolerance, decreased balance, decreased coordination, difficulty walking, impaired flexibility, and pain.    PLAN:  PT FREQUENCY: 2x/week  PT DURATION: 8 weeks  PLANNED INTERVENTIONS: 97164- PT Re-evaluation, 97110-Therapeutic exercises, 97530- Therapeutic activity, [id number]- Neuromuscular re-education, 97535- Self Care, [id number]- Manual therapy, [phone]- Gait training, Patient/Family education, Balance training, Taping, and DME instructions  PLAN FOR NEXT  SESSION: PWR! Exercises considering R foot placement, work on tips to  reduce freezing with gait, toe isolated movements (to review prior there ex as indicated), balance, gait. *check STGs next week. Have patient schedule further out.  Gwendy Boeder, PT 10/10/2023, 11:59 AM

## 2023-10-15 ENCOUNTER — Encounter: Payer: Self-pay | Admitting: Physical Therapy

## 2023-10-15 ENCOUNTER — Ambulatory Visit: Admitting: Physical Therapy

## 2023-10-15 DIAGNOSIS — R2689 Other abnormalities of gait and mobility: Secondary | ICD-10-CM

## 2023-10-15 DIAGNOSIS — R2681 Unsteadiness on feet: Secondary | ICD-10-CM | POA: Diagnosis not present

## 2023-10-15 DIAGNOSIS — M25571 Pain in right ankle and joints of right foot: Secondary | ICD-10-CM

## 2023-10-15 DIAGNOSIS — M6281 Muscle weakness (generalized): Secondary | ICD-10-CM | POA: Diagnosis not present

## 2023-10-15 DIAGNOSIS — R29818 Other symptoms and signs involving the nervous system: Secondary | ICD-10-CM | POA: Diagnosis not present

## 2023-10-15 NOTE — Therapy (Signed)
 OUTPATIENT PHYSICAL THERAPY TREATMENT  Patient Name: Marc Reynolds MRN: [id number] DOB:08-Nov-1950, 73 y.o., male Today's Date: 10/15/2023  END OF SESSION:  PT End of Session - 10/15/23 1449     Visit Number 6    Number of Visits 16    Date for PT Re-Evaluation 11/18/23    Authorization Type [employer] Medicare    Authorization Time Period 16 approved through 11/14/23    Authorization - Visit Number 5    Progress Note Due on Visit 10    PT Start Time 1400    PT Stop Time 1445    PT Time Calculation (min) 45 min    Activity Tolerance Patient tolerated treatment well    Behavior During Therapy [facility] for tasks assessed/performed              Past Medical History:  Diagnosis Date   Anxiety    Cancer (HCC)    Prostate 2022   Depression    Neuromuscular disorder (HCC)    Parkinson's 2021   Varicose veins of legs    History reviewed. No pertinent surgical history. Patient Active Problem List   Diagnosis Date Noted   Left foot pain 11/07/2022   Visit for suture removal 10/15/2022   Primary osteoarthritis of left knee 08/30/2022   Malignant neoplasm of prostate (HCC) 12/04/2020   Well adult exam 12/14/2019   Resting tremor 12/14/2019   Parkinsonism (HCC) 05/01/2018   Hypertriglyceridemia 09/11/2015   Leukocytosis 09/11/2015   Anxiety and depression 08/11/2015   BPH (benign prostatic hyperplasia) 08/11/2015   PCP: Adela Holter REFERRING PROVIDER: Carlton Chick, MD  REFERRING DIAG: peroneal tendonitis G20.C (ICD-10-CM) - Parkinsonism, unspecified  THERAPY DIAG:  Other abnormalities of gait and mobility  Pain in right ankle and joints of right foot  Rationale for Evaluation and Treatment: Rehabilitation  ONSET DATE: 09/01/23  SUBJECTIVE:   SUBJECTIVE STATEMENT: Pt states he continues with pain on the lateral side of Lt foot. Pain is 6/10 at worst. He states he is seeing a chiropractor for neuropathy  PERTINENT HISTORY: Parkinson's Disease, prostate  cancer, anxiety, depression  The patient reports his feet still hurt and he continues with foot turning in (walking in inversion and "it pulls on my ankle").  The bilateral foot issue is fairly new and he notes pain on both sides. Pt states he still feels like his ankle is "turning in " when he is on uneven surfaces. He has been wearing an ankle brace and states this helps. For Parkinsonism, he notes tremors. He has not had a fall in one year.  Underwent med changes with neurologist in the last week. He is on levadopa 2mg  3x/day  PAIN:  Are you having pain? Yes: NPRS scale: 6/10 at worst Pain location: ball of left foot, pain on the side/top of the R foot. Pain description: burning/sharp Aggravating factors: standing or walking for prolonged time Relieving factors: rest, meds  *kept him awake last night and ibuprofen helped  PRECAUTIONS: Fall  WEIGHT BEARING RESTRICTIONS: No  FALLS:  Has patient fallen in last 6 months? No  PATIENT GOALS: reduce pain  OBJECTIVE:  Note: Objective measures were completed at Evaluation unless otherwise noted. COGNITION: Overall cognitive status: Within functional limits for tasks assessed   COORDINATION: Overall diminished coordination   MUSCLE LENGTH: Hamstrings: tight hamstrings noted bilaterally with MMT knee extension  POSTURE:  weight shifted more to L LE with narrow base of support and mild ankle inversion R  LOWER EXTREMITY ROM:  WFLs  LOWER EXTREMITY MMT:   MMT Right Eval Left Eval  Hip flexion 5/5 5/5  Hip extension    Hip abduction    Hip adduction    Hip internal rotation    Hip external rotation    Knee flexion 5/5 5/5  Knee extension 5/5 5/5  Ankle dorsiflexion 3+/5 4/5  Ankle plantarflexion    Ankle inversion    Ankle eversion    (Blank rows = not tested)  BED MOBILITY:  independent  TRANSFERS: Assistive device utilized: None  Sit to stand: Complete Independence Stand to sit: Complete  Independence  STAIRS: Comments: TBA  GAIT: Gait pattern: decreased arm swing- Right, decreased arm swing- Left, decreased stance time- Right, decreased stride length, decreased trunk rotation, and poor foot clearance- Right Distance walked: 12ft Assistive device utilized: None Level of assistance: Modified independence Comments: gait speed=2.16 ft/sec  FUNCTIONAL TESTS:  5 times sit to stand: 11.83 seconds Timed up and go (TUG): 9.96 seconds Berg Balance Scale: 48/56 Mini Best Test: 15/28  [facility] Adult PT Treatment:                                                DATE: 10/15/23  Neuromuscular re-ed: Seated PWR! Step PWR! twist Supine PWR! Step PWR! twist Quadruped A/P rocking Spinal twist Standing PWR! Up sit <> stand with cues for large amplitude movements PWR! Twist PWR! step Therapeutic Activity: Supine Typewriter bridge Bed mobility training scooting side to side - focus on segmental moving and clearing hips and shoulders Gait: Gait training focus on heel strike and foot clearance   [facility] Adult PT Treatment:                                                DATE: 10/10/23 Neuromuscular re-ed: Standing PWR! Moves Rock and reach x 10 reps Twist x 10 reps with cues to hit middle "T" Transition sit<>stand with verbal and demo cues Therapeutic Activity: Supine PWR! Moves Large amplitude stepping x 10 reps Twist x 10 reps Weight shift x 5 reps Prone PWR! Moves Min A to move floor<>stand with UE support and tactile cues for motor planning + demo PWR! Up and reaching for weight shift [employer] ant/posterior Attempted PWR up to tall kneel but unable, therefore used physioball to move into tall kneel and weight shift R and L opening (start of PWR twist moves) Gait: Verbal cues for "reach with your heels" and adding tactile cues for arm swing that patient is able to carryover with repetition X 240 ft     [facility] Adult PT Treatment:                                                 DATE: 10/08/23 Therapeutic Exercise/Activity/NMR: Grant Lay! Up with step forward Stepping and wt shifting to various colored dots with focus on keeping Rt ankle neutral Standing on foam with wide BOS with tap up with Lt LE focus on Rt ankle neutral Standing on foam with wt shift focus on Rt ankle neutral Gait fwd/bkwd/sideways all focus on neutral ankle and improving step  and stride length PWR! Step all directions   [facility] Adult PT Treatment:                                                DATE: 09/30/23 Therapeutic Exercise/Activity/NMR: Grant Lay! Step forward, laterally, diagonal - improved neutral ankle position noted today Tandem stance on foam - pt requires UE support with Rt LE in back due to decreased ankle strength and stability Tandem stance on ground with pt better able to control Rt ankle position Standing with towel roll under Rt lateral foot to improve neutral ankle position - mini squats, PWR! Up  Gait forward and backward focus on improving neutral ankle position    PATIENT EDUCATION: Education details: HEP Person educated: Patient Education method: Explanation, Demonstration, and Handouts Education comprehension: verbalized understanding, returned demonstration, and needs further education  HOME EXERCISE PROGRAM:    GOALS: Goals reviewed with patient? Yes  SHORT TERM GOALS: Target date: 10/18/23  The patient will be indep with initial HEP Baseline: initiated at eval Goal status: MET  2.  The patient will report pain in bilateral feet < or equal to 2/10. Baseline:  4/10 Goal status: NOT MET  LONG TERM GOALS: Target date: 11/18/23  The patient will be indep with progression of HEP. Baseline:  initiated at eval Goal status: INITIAL  2.  The patient will improve miniBEST to > or equal to 19/28. Baseline: 15/28 Goal status: INITIAL  3.  The patient will improve gait speed to > or equal to 2.6 ft/sec Baseline: 2.16 ft/sec Goal status: INITIAL  4.  The  patient will verbalize understanding of strategies to reduce freezing episodes during gait. Baseline:  R foot heavy and pt reports he cannot lift it Goal status: INITIAL  5.  The patient will maintain single limb standing on R side x 8 seconds. Baseline:  2 seconds Goal status: INITIAL  6.  The patient will be indep with post d/c PWR exercise and have list of community resources. Baseline:  discussed at eval Goal status: INITIAL  ASSESSMENT:  CLINICAL IMPRESSION: Session focused on increasing abdominal strength to improver performance and efficiency with bed mobility. Pt more limited with muscle activation on Lt but responds well to repetition and tactile cuing. Pt states he does plan to get a new mattress which may help with bed mobility issues. Pt continues with pain in Lt foot but does decrease pain during gait with cues for heel strike  OBJECTIVE IMPAIRMENTS: Abnormal gait, decreased activity tolerance, decreased balance, decreased coordination, difficulty walking, impaired flexibility, and pain.    PLAN:  PT FREQUENCY: 2x/week  PT DURATION: 8 weeks  PLANNED INTERVENTIONS: 97164- PT Re-evaluation, 97110-Therapeutic exercises, 97530- Therapeutic activity, 97112- Neuromuscular re-education, 97535- Self Care, [id number]- Manual therapy, [phone]- Gait training, Patient/Family education, Balance training, Taping, and DME instructions  PLAN FOR NEXT SESSION: UPDATE HEP, PWR! Exercises considering R foot placement, work on tips to reduce freezing with gait, toe isolated movements (to review prior there ex as indicated), balance, gait.   Melvinia Mandato, PT 10/15/2023, 2:50 PM

## 2023-10-17 ENCOUNTER — Encounter: Payer: Self-pay | Admitting: Physical Therapy

## 2023-10-17 ENCOUNTER — Ambulatory Visit: Attending: Podiatry | Admitting: Physical Therapy

## 2023-10-17 DIAGNOSIS — M25571 Pain in right ankle and joints of right foot: Secondary | ICD-10-CM | POA: Insufficient documentation

## 2023-10-17 DIAGNOSIS — R2689 Other abnormalities of gait and mobility: Secondary | ICD-10-CM | POA: Diagnosis not present

## 2023-10-17 DIAGNOSIS — R2681 Unsteadiness on feet: Secondary | ICD-10-CM | POA: Insufficient documentation

## 2023-10-17 DIAGNOSIS — R29818 Other symptoms and signs involving the nervous system: Secondary | ICD-10-CM | POA: Diagnosis not present

## 2023-10-17 DIAGNOSIS — M6281 Muscle weakness (generalized): Secondary | ICD-10-CM | POA: Insufficient documentation

## 2023-10-17 NOTE — Therapy (Signed)
 OUTPATIENT PHYSICAL THERAPY TREATMENT  Patient Name: Marc Reynolds MRN: [id number] DOB:[birth year], 73 y.o., male Today's Date: 10/17/2023  END OF SESSION:  PT End of Session - 10/17/23 1146     Visit Number 7    Number of Visits 16    Date for PT Re-Evaluation 11/18/23    Authorization Type [employer] Medicare    Authorization Time Period 16 approved through 11/14/23    Authorization - Visit Number 6    Progress Note Due on Visit 10    PT Start Time 1100    PT Stop Time 1140    PT Time Calculation (min) 40 min    Activity Tolerance Patient tolerated treatment well    Behavior During Therapy [facility] for tasks assessed/performed               Past Medical History:  Diagnosis Date   Anxiety    Cancer (HCC)    Prostate 2022   Depression    Neuromuscular disorder (HCC)    Parkinson's 2021   Varicose veins of legs    History reviewed. No pertinent surgical history. Patient Active Problem List   Diagnosis Date Noted   Left foot pain 11/07/2022   Visit for suture removal 10/15/2022   Primary osteoarthritis of left knee 08/30/2022   Malignant neoplasm of prostate (HCC) 12/04/2020   Well adult exam 12/14/2019   Resting tremor 12/14/2019   Parkinsonism (HCC) 05/01/2018   Hypertriglyceridemia 09/11/2015   Leukocytosis 09/11/2015   Anxiety and depression 08/11/2015   BPH (benign prostatic hyperplasia) 08/11/2015   PCP: Adela Holter REFERRING PROVIDER: Carlton Chick, MD  REFERRING DIAG: peroneal tendonitis G20.C (ICD-10-CM) - Parkinsonism, unspecified  THERAPY DIAG:  Other abnormalities of gait and mobility  Pain in right ankle and joints of right foot  Rationale for Evaluation and Treatment: Rehabilitation  ONSET DATE: 09/01/23  SUBJECTIVE:   SUBJECTIVE STATEMENT: Pt states his Lt lateral foot continues to hurt. He was able to walk through grass and on uneven surfaces and he did not roll his ankle. He feels his ankles are getting stronger  PERTINENT  HISTORY: Parkinson's Disease, prostate cancer, anxiety, depression  The patient reports his feet still hurt and he continues with foot turning in (walking in inversion and "it pulls on my ankle").  The bilateral foot issue is fairly new and he notes pain on both sides. Pt states he still feels like his ankle is "turning in " when he is on uneven surfaces. He has been wearing an ankle brace and states this helps. For Parkinsonism, he notes tremors. He has not had a fall in one year.  Underwent med changes with neurologist in the last week. He is on levadopa 2mg  3x/day  PAIN:  Are you having pain? Yes: NPRS scale: 6/10 at worst Pain location: Lt lateral foot Pain description: burning/sharp Aggravating factors: standing or walking for prolonged time Relieving factors: rest, meds  *kept him awake last night and ibuprofen helped  PRECAUTIONS: Fall  WEIGHT BEARING RESTRICTIONS: No  FALLS:  Has patient fallen in last 6 months? No  PATIENT GOALS: reduce pain  OBJECTIVE:  Note: Objective measures were completed at Evaluation unless otherwise noted. COGNITION: Overall cognitive status: Within functional limits for tasks assessed   COORDINATION: Overall diminished coordination   MUSCLE LENGTH: Hamstrings: tight hamstrings noted bilaterally with MMT knee extension  POSTURE:  weight shifted more to L LE with narrow base of support and mild ankle inversion R  LOWER EXTREMITY ROM:    WFLs  LOWER EXTREMITY MMT:   MMT Right Eval Left Eval  Hip flexion 5/5 5/5  Hip extension    Hip abduction    Hip adduction    Hip internal rotation    Hip external rotation    Knee flexion 5/5 5/5  Knee extension 5/5 5/5  Ankle dorsiflexion 3+/5 4/5  Ankle plantarflexion    Ankle inversion    Ankle eversion    (Blank rows = not tested)  BED MOBILITY:  independent  TRANSFERS: Assistive device utilized: None  Sit to stand: Complete Independence Stand to sit: Complete  Independence  STAIRS: Comments: TBA  GAIT: Gait pattern: decreased arm swing- Right, decreased arm swing- Left, decreased stance time- Right, decreased stride length, decreased trunk rotation, and poor foot clearance- Right Distance walked: 171ft Assistive device utilized: None Level of assistance: Modified independence Comments: gait speed=2.16 ft/sec  FUNCTIONAL TESTS:  5 times sit to stand: 11.83 seconds Timed up and go (TUG): 9.96 seconds Berg Balance Scale: 48/56 Mini Best Test: 15/28  [facility] Adult PT Treatment:                                                DATE: 10/17/23  Neuromuscular re-ed: Supine Pelvic tilt Bridge PWR! Twist Standing PWR! Up sit <> stand PWR! Step  PWR! Step variations  stepping onto airex foam with UE support with focus on ankle stability Step up 6'' step plus foam x 10 bilat focus on ankle stability SLS on foam with 1 UE support with hip half circle with opp LE on pillowcase - focus on ankle stability   [facility] Adult PT Treatment:                                                DATE: 10/15/23  Neuromuscular re-ed: Seated PWR! Step PWR! twist Supine PWR! Step PWR! twist Quadruped A/P rocking Spinal twist Standing PWR! Up sit <> stand with cues for large amplitude movements PWR! Twist PWR! step Therapeutic Activity: Supine Typewriter bridge Bed mobility training scooting side to side - focus on segmental moving and clearing hips and shoulders Gait: Gait training focus on heel strike and foot clearance   [facility] Adult PT Treatment:                                                DATE: 10/10/23 Neuromuscular re-ed: Standing PWR! Moves Rock and reach x 10 reps Twist x 10 reps with cues to hit middle "T" Transition sit<>stand with verbal and demo cues Therapeutic Activity: Supine PWR! Moves Large amplitude stepping x 10 reps Twist x 10 reps Weight shift x 5 reps Prone PWR! Moves Min A to move floor<>stand with UE support and tactile cues  for motor planning + demo PWR! Up and reaching for weight shift [employer] ant/posterior Attempted PWR up to tall kneel but unable, therefore used physioball to move into tall kneel and weight shift R and L opening (start of PWR twist moves) Gait: Verbal cues for "reach with your heels" and adding tactile cues for arm swing that patient is able to carryover with  repetition X 240 ft     PATIENT EDUCATION: Education details: HEP Person educated: Patient Education method: Explanation, Demonstration, and Handouts Education comprehension: verbalized understanding, returned demonstration, and needs further education  HOME EXERCISE PROGRAM: Access Code: URL: [email or web address] Date: 10/17/2023 Prepared by: Lowery Rue  Exercises - Supine Bridge  - 1 x daily - 7 x weekly - 3 sets - 10 reps - Supine Posterior Pelvic Tilt  - 1 x daily - 7 x weekly - 3 sets - 10 reps - Seated Pelvic Tilt  - 1 x daily - 7 x weekly - 3 sets - 10 reps    GOALS: Goals reviewed with patient? Yes  SHORT TERM GOALS: Target date: 10/18/23  The patient will be indep with initial HEP Baseline: initiated at eval Goal status: MET  2.  The patient will report pain in bilateral feet < or equal to 2/10. Baseline:  4/10 Goal status: NOT MET  LONG TERM GOALS: Target date: 11/18/23  The patient will be indep with progression of HEP. Baseline:  initiated at eval Goal status: INITIAL  2.  The patient will improve miniBEST to > or equal to 19/28. Baseline: 15/28 Goal status: INITIAL  3.  The patient will improve gait speed to > or equal to 2.6 ft/sec Baseline: 2.16 ft/sec Goal status: INITIAL  4.  The patient will verbalize understanding of strategies to reduce freezing episodes during gait. Baseline:  R foot heavy and pt reports he cannot lift it Goal status: INITIAL  5.  The patient will maintain single limb standing on R side x 8 seconds. Baseline:  2 seconds Goal  status: INITIAL  6.  The patient will be indep with post d/c PWR exercise and have list of community resources. Baseline:  discussed at eval Goal status: INITIAL  ASSESSMENT:  CLINICAL IMPRESSION: Pt is improving ankle stability. Progressed strength and coordination challenge with addition of foam to PWR! Step activities. Added core strengthening per pt request to address diastasis recti. Updated HEP  OBJECTIVE IMPAIRMENTS: Abnormal gait, decreased activity tolerance, decreased balance, decreased coordination, difficulty walking, impaired flexibility, and pain.    PLAN:  PT FREQUENCY: 2x/week  PT DURATION: 8 weeks  PLANNED INTERVENTIONS: 97164- PT Re-evaluation, 97110-Therapeutic exercises, 97530- Therapeutic activity, [id number]- Neuromuscular re-education, 97535- Self Care, [id number]- Manual therapy, [phone]- Gait training, Patient/Family education, Balance training, Taping, and DME instructions  PLAN FOR NEXT SESSION:  PWR! Exercises considering R foot placement, work on tips to reduce freezing with gait, Ankle stability, bed mobility Larysa Pall, PT 10/17/2023, 11:46 AM

## 2023-10-20 DIAGNOSIS — R350 Frequency of micturition: Secondary | ICD-10-CM | POA: Diagnosis not present

## 2023-10-20 DIAGNOSIS — R3912 Poor urinary stream: Secondary | ICD-10-CM | POA: Diagnosis not present

## 2023-10-22 ENCOUNTER — Other Ambulatory Visit: Payer: Self-pay | Admitting: Urology

## 2023-10-22 ENCOUNTER — Ambulatory Visit: Admitting: Physical Therapy

## 2023-10-22 ENCOUNTER — Encounter: Payer: Self-pay | Admitting: Physical Therapy

## 2023-10-22 DIAGNOSIS — R2689 Other abnormalities of gait and mobility: Secondary | ICD-10-CM

## 2023-10-22 DIAGNOSIS — C61 Malignant neoplasm of prostate: Secondary | ICD-10-CM

## 2023-10-22 DIAGNOSIS — R29818 Other symptoms and signs involving the nervous system: Secondary | ICD-10-CM | POA: Diagnosis not present

## 2023-10-22 DIAGNOSIS — R2681 Unsteadiness on feet: Secondary | ICD-10-CM | POA: Diagnosis not present

## 2023-10-22 DIAGNOSIS — M25571 Pain in right ankle and joints of right foot: Secondary | ICD-10-CM

## 2023-10-22 DIAGNOSIS — M6281 Muscle weakness (generalized): Secondary | ICD-10-CM | POA: Diagnosis not present

## 2023-10-22 NOTE — Therapy (Signed)
 OUTPATIENT PHYSICAL THERAPY TREATMENT  Patient Name: Marc Reynolds MRN: [id number] DOB:1951-04-24, 73 y.o., male Today's Date: 10/22/2023  END OF SESSION:  PT End of Session - 10/22/23 1142     Visit Number 8    Number of Visits 16    Date for PT Re-Evaluation 11/18/23    Authorization Type [employer] Medicare    Authorization Time Period 16 approved through 11/14/23    Authorization - Visit Number 7    Progress Note Due on Visit 10    PT Start Time 1100    PT Stop Time 1143    PT Time Calculation (min) 43 min    Activity Tolerance Patient tolerated treatment well    Behavior During Therapy [facility] for tasks assessed/performed                Past Medical History:  Diagnosis Date   Anxiety    Cancer (HCC)    Prostate 2022   Depression    Neuromuscular disorder (HCC)    Parkinson's 2021   Varicose veins of legs    History reviewed. No pertinent surgical history. Patient Active Problem List   Diagnosis Date Noted   Left foot pain 11/07/2022   Visit for suture removal 10/15/2022   Primary osteoarthritis of left knee 08/30/2022   Malignant neoplasm of prostate (HCC) 12/04/2020   Well adult exam 12/14/2019   Resting tremor 12/14/2019   Parkinsonism (HCC) 05/01/2018   Hypertriglyceridemia 09/11/2015   Leukocytosis 09/11/2015   Anxiety and depression 08/11/2015   BPH (benign prostatic hyperplasia) 08/11/2015   PCP: Adela Holter REFERRING PROVIDER: Carlton Chick, MD  REFERRING DIAG: peroneal tendonitis G20.C (ICD-10-CM) - Parkinsonism, unspecified  THERAPY DIAG:  Other abnormalities of gait and mobility  Pain in right ankle and joints of right foot  Rationale for Evaluation and Treatment: Rehabilitation  ONSET DATE: 09/01/23  SUBJECTIVE:   SUBJECTIVE STATEMENT: Pt states the pain in his Lt lateral foot is improving from the infared treatment he is doing at home. He states he continues to feel more stability in Lt ankle when walking outdoors  PERTINENT  HISTORY: Parkinson's Disease, prostate cancer, anxiety, depression  The patient reports his feet still hurt and he continues with foot turning in (walking in inversion and "it pulls on my ankle").  The bilateral foot issue is fairly new and he notes pain on both sides. Pt states he still feels like his ankle is "turning in " when he is on uneven surfaces. He has been wearing an ankle brace and states this helps. For Parkinsonism, he notes tremors. He has not had a fall in one year.  Underwent med changes with neurologist in the last week. He is on levadopa 2mg  3x/day  PAIN:  Are you having pain? Yes: NPRS scale: 4/10 at worst Pain location: Lt lateral foot Pain description: burning/sharp Aggravating factors: standing or walking for prolonged time Relieving factors: rest, meds  *kept him awake last night and ibuprofen helped  PRECAUTIONS: Fall  WEIGHT BEARING RESTRICTIONS: No  FALLS:  Has patient fallen in last 6 months? No  PATIENT GOALS: reduce pain  OBJECTIVE:  Note: Objective measures were completed at Evaluation unless otherwise noted. COGNITION: Overall cognitive status: Within functional limits for tasks assessed   COORDINATION: Overall diminished coordination   MUSCLE LENGTH: Hamstrings: tight hamstrings noted bilaterally with MMT knee extension  POSTURE:  weight shifted more to L LE with narrow base of support and mild ankle inversion R  LOWER EXTREMITY ROM:  WFLs  LOWER EXTREMITY MMT:   MMT Right Eval Left Eval  Hip flexion 5/5 5/5  Hip extension    Hip abduction    Hip adduction    Hip internal rotation    Hip external rotation    Knee flexion 5/5 5/5  Knee extension 5/5 5/5  Ankle dorsiflexion 3+/5 4/5  Ankle plantarflexion    Ankle inversion    Ankle eversion    (Blank rows = not tested)  BED MOBILITY:  independent  TRANSFERS: Assistive device utilized: None  Sit to stand: Complete Independence Stand to sit: Complete  Independence  STAIRS: Comments: TBA  GAIT: Gait pattern: decreased arm swing- Right, decreased arm swing- Left, decreased stance time- Right, decreased stride length, decreased trunk rotation, and poor foot clearance- Right Distance walked: 166ft Assistive device utilized: None Level of assistance: Modified independence Comments: gait speed=2.16 ft/sec  FUNCTIONAL TESTS:  5 times sit to stand: 11.83 seconds Timed up and go (TUG): 9.96 seconds Berg Balance Scale: 48/56 Mini Best Test: 15/28  [facility] Adult PT Treatment:                                                DATE: 10/22/23  Neuromuscular re-ed: [place]! Stepping with resistance - 10# fwd/laterally Tap ups 8'' step with 2 kg med ball lift initially Rt only - focus on Lt LE stability, then alternating focus on coordination with CGA for balance Coordination tapping cones with alternating LEs - CGA for balance, increased time for LT LE coordination Dribbling 2kg medicine ball with alternating feet focusing on Lt LE coordination Dribble 2kg med ball through goal with continued focus on coordination   [facility] Adult PT Treatment:                                                DATE: 10/17/23  Neuromuscular re-ed: Supine Pelvic tilt Bridge PWR! Twist Standing PWR! Up sit <> stand PWR! Step  PWR! Step variations  stepping onto airex foam with UE support with focus on ankle stability Step up 6'' step plus foam x 10 bilat focus on ankle stability SLS on foam with 1 UE support with hip half circle with opp LE on pillowcase - focus on ankle stability   [facility] Adult PT Treatment:                                                DATE: 10/15/23  Neuromuscular re-ed: Seated PWR! Step PWR! twist Supine PWR! Step PWR! twist Quadruped A/P rocking Spinal twist Standing PWR! Up sit <> stand with cues for large amplitude movements PWR! Twist PWR! step Therapeutic Activity: Supine Typewriter bridge Bed mobility training scooting side to side  - focus on segmental moving and clearing hips and shoulders Gait: Gait training focus on heel strike and foot clearance    PATIENT EDUCATION: Education details: HEP Person educated: Patient Education method: Programmer, multimedia, Demonstration, and Handouts Education comprehension: verbalized understanding, returned demonstration, and needs further education  HOME EXERCISE PROGRAM: Access Code: URL: [email or web address] Date: 10/17/2023 Prepared by: Lowery Rue  Exercises - Supine Bridge  -  1 x daily - 7 x weekly - 3 sets - 10 reps - Supine Posterior Pelvic Tilt  - 1 x daily - 7 x weekly - 3 sets - 10 reps - Seated Pelvic Tilt  - 1 x daily - 7 x weekly - 3 sets - 10 reps    GOALS: Goals reviewed with patient? Yes  SHORT TERM GOALS: Target date: 10/18/23  The patient will be indep with initial HEP Baseline: initiated at eval Goal status: MET  2.  The patient will report pain in bilateral feet < or equal to 2/10. Baseline:  4/10 Goal status: NOT MET  LONG TERM GOALS: Target date: 11/18/23  The patient will be indep with progression of HEP. Baseline:  initiated at eval Goal status: INITIAL  2.  The patient will improve miniBEST to > or equal to 19/28. Baseline: 15/28 Goal status: INITIAL  3.  The patient will improve gait speed to > or equal to 2.6 ft/sec Baseline: 2.16 ft/sec Goal status: INITIAL  4.  The patient will verbalize understanding of strategies to reduce freezing episodes during gait. Baseline:  R foot heavy and pt reports he cannot lift it Goal status: INITIAL  5.  The patient will maintain single limb standing on R side x 8 seconds. Baseline:  2 seconds Goal status: INITIAL  6.  The patient will be indep with post d/c PWR exercise and have list of community resources. Baseline:  discussed at eval Goal status: INITIAL  ASSESSMENT:  CLINICAL IMPRESSION: Focused on coordination this session with pt with notable deficits with Lt  LE vs Rt LE. He requires CGA for balance with coordination activities due to decreased balance especially when stabilizing with Lt LE  OBJECTIVE IMPAIRMENTS: Abnormal gait, decreased activity tolerance, decreased balance, decreased coordination, difficulty walking, impaired flexibility, and pain.    PLAN:  PT FREQUENCY: 2x/week  PT DURATION: 8 weeks  PLANNED INTERVENTIONS: 97164- PT Re-evaluation, 97110-Therapeutic exercises, 97530- Therapeutic activity, 97112- Neuromuscular re-education, 97535- Self Care, 11914- Manual therapy, [phone]- Gait training, Patient/Family education, Balance training, Taping, and DME instructions  PLAN FOR NEXT SESSION:  CHECK GAIT SPEED AND mini BEST PWR! Exercises considering R foot placement, work on tips to reduce freezing with gait, Ankle stability, bed mobility Cleatis Fandrich, PT 10/22/2023, 11:43 AM

## 2023-10-24 ENCOUNTER — Ambulatory Visit

## 2023-10-24 DIAGNOSIS — M25571 Pain in right ankle and joints of right foot: Secondary | ICD-10-CM

## 2023-10-24 DIAGNOSIS — R2681 Unsteadiness on feet: Secondary | ICD-10-CM

## 2023-10-24 DIAGNOSIS — R29818 Other symptoms and signs involving the nervous system: Secondary | ICD-10-CM

## 2023-10-24 DIAGNOSIS — M6281 Muscle weakness (generalized): Secondary | ICD-10-CM

## 2023-10-24 DIAGNOSIS — R2689 Other abnormalities of gait and mobility: Secondary | ICD-10-CM

## 2023-10-24 NOTE — Therapy (Signed)
 OUTPATIENT PHYSICAL THERAPY TREATMENT  Patient Name: Marc Reynolds MRN: [id number] DOB:October 07, 1950, 73 y.o., male Today's Date: 10/24/2023  END OF SESSION:  PT End of Session - 10/24/23 1019     Visit Number 9    Number of Visits 16    Date for PT Re-Evaluation 11/18/23    Authorization Type [employer] Medicare    Authorization Time Period 16 approved through 11/14/23    Authorization - Visit Number 9    Authorization - Number of Visits 16    Progress Note Due on Visit 10    PT Start Time 1020    PT Stop Time 1058    PT Time Calculation (min) 38 min    Activity Tolerance Patient tolerated treatment well    Behavior During Therapy [facility] for tasks assessed/performed            Past Medical History:  Diagnosis Date   Anxiety    Cancer (HCC)    Prostate 2022   Depression    Neuromuscular disorder (HCC)    Parkinson's 2021   Varicose veins of legs    History reviewed. No pertinent surgical history. Patient Active Problem List   Diagnosis Date Noted   Left foot pain 11/07/2022   Visit for suture removal 10/15/2022   Primary osteoarthritis of left knee 08/30/2022   Malignant neoplasm of prostate (HCC) 12/04/2020   Well adult exam 12/14/2019   Resting tremor 12/14/2019   Parkinsonism (HCC) 05/01/2018   Hypertriglyceridemia 09/11/2015   Leukocytosis 09/11/2015   Anxiety and depression 08/11/2015   BPH (benign prostatic hyperplasia) 08/11/2015   PCP: Adela Holter REFERRING PROVIDER: Carlton Chick, MD  REFERRING DIAG: peroneal tendonitis G20.C (ICD-10-CM) - Parkinsonism, unspecified  THERAPY DIAG:  Other abnormalities of gait and mobility  Pain in right ankle and joints of right foot  Unsteadiness on feet  Other symptoms and signs involving the nervous system  Muscle weakness (generalized)  Rationale for Evaluation and Treatment: Rehabilitation  ONSET DATE: 09/01/23  SUBJECTIVE:   SUBJECTIVE STATEMENT: Patient reports his ankle was doing better but  noticed yesterday his ankle felt fatigued and noticed it wanted to roll out to the side. Patient states he walked a mile yesterday; has less pain in L foot.   PERTINENT HISTORY: Parkinson's Disease, prostate cancer, anxiety, depression  The patient reports his feet still hurt and he continues with foot turning in (walking in inversion and "it pulls on my ankle").  The bilateral foot issue is fairly new and he notes pain on both sides. Pt states he still feels like his ankle is "turning in " when he is on uneven surfaces. He has been wearing an ankle brace and states this helps. For Parkinsonism, he notes tremors. He has not had a fall in one year.  Underwent med changes with neurologist in the last week. He is on levadopa 2mg  3x/day  PAIN:  Are you having pain? Yes: NPRS scale: 4/10 at worst Pain location: Lt lateral foot Pain description: burning/sharp Aggravating factors: standing or walking for prolonged time Relieving factors: rest, meds *kept him awake last night and ibuprofen helped  PRECAUTIONS: Fall  WEIGHT BEARING RESTRICTIONS: No  FALLS:  Has patient fallen in last 6 months? No  PATIENT GOALS: reduce pain  OBJECTIVE:  Note: Objective measures were completed at Evaluation unless otherwise noted. COGNITION: Overall cognitive status: Within functional limits for tasks assessed   COORDINATION: Overall diminished coordination   MUSCLE LENGTH: Hamstrings: tight hamstrings noted bilaterally with MMT knee extension  POSTURE: weight shifted more to L LE with narrow base of support and mild ankle inversion R  LOWER EXTREMITY ROM:    WFLs  LOWER EXTREMITY MMT:   MMT Right Eval Left Eval  Hip flexion 5/5 5/5  Hip extension    Hip abduction    Hip adduction    Hip internal rotation    Hip external rotation    Knee flexion 5/5 5/5  Knee extension 5/5 5/5  Ankle dorsiflexion 3+/5 4/5  Ankle plantarflexion    Ankle inversion    Ankle eversion    (Blank rows = not  tested)  BED MOBILITY:  independent  TRANSFERS: Assistive device utilized: None  Sit to stand: Complete Independence Stand to sit: Complete Independence  STAIRS: Comments: TBA  GAIT: Gait pattern: decreased arm swing- Right, decreased arm swing- Left, decreased stance time- Right, decreased stride length, decreased trunk rotation, and poor foot clearance- Right Distance walked: 140ft Assistive device utilized: None Level of assistance: Modified independence Comments: gait speed=2.16 ft/sec  FUNCTIONAL TESTS:  5 times sit to stand: 11.83 seconds Timed up and go (TUG): 9.96 seconds Berg Balance Scale: 48/56 Mini Best Test: 15/28   [facility] Adult PT Treatment:                                                DATE: 10/24/2023 Therapeutic Exercise: Supine:  PPT + breathing cues Bridges + ball b/w knees Core marching S/L open books Standing: Heel raises with ball b/w ankles Toe raises with ball b/w ankles Neuromuscular re-ed: Big lateral step out with arm reach Cross body reaching  Colored dot foot tap --> step out with weight shifting Marching + opp knee taps Marching + opp arm reach up Therapeutic Activity: Seated marching + opp arm overhead reach STS --> focus on anterior weight shifting, arm reaching forward STS --> hands placed on therapists for fwd weight shift cue and UE support for balance Standing marching + opp arm overhead reach   [facility] Adult PT Treatment:                                                DATE: 10/22/23 Neuromuscular re-ed: PWR! Stepping with resistance - 10# fwd/laterally Tap ups 8'' step with 2 kg med ball lift initially Rt only - focus on Lt LE stability, then alternating focus on coordination with CGA for balance Coordination tapping cones with alternating LEs - CGA for balance, increased time for LT LE coordination Dribbling 2kg medicine ball with alternating feet focusing on Lt LE coordination Dribble 2kg med ball through goal with continued focus  on coordination   [facility] Adult PT Treatment:                                                DATE: 10/17/23 Neuromuscular re-ed: Supine Pelvic tilt Bridge PWR! Twist Standing PWR! Up sit <> stand PWR! Step  PWR! Step variations  stepping onto airex foam with UE support with focus on ankle stability Step up 6'' step plus foam x 10 bilat focus on ankle stability SLS on foam with 1  UE support with hip half circle with opp LE on pillowcase - focus on ankle stability   [facility] Adult PT Treatment:                                                DATE: 10/15/23  Neuromuscular re-ed: Seated PWR! Step PWR! twist Supine PWR! Step PWR! twist Quadruped A/P rocking Spinal twist Standing PWR! Up sit <> stand with cues for large amplitude movements PWR! Twist PWR! step Therapeutic Activity: Supine Typewriter bridge Bed mobility training scooting side to side - focus on segmental moving and clearing hips and shoulders Gait: Gait training focus on heel strike and foot clearance    PATIENT EDUCATION: Education details: HEP Person educated: Patient Education method: Programmer, multimedia, Demonstration, and Handouts Education comprehension: verbalized understanding, returned demonstration, and needs further education  HOME EXERCISE PROGRAM: Access Code: URL: [email or web address] Date: 10/17/2023 Prepared by: Lowery Rue  Exercises - Supine Bridge  - 1 x daily - 7 x weekly - 3 sets - 10 reps - Supine Posterior Pelvic Tilt  - 1 x daily - 7 x weekly - 3 sets - 10 reps - Seated Pelvic Tilt  - 1 x daily - 7 x weekly - 3 sets - 10 reps    GOALS: Goals reviewed with patient? Yes  SHORT TERM GOALS: Target date: 10/18/23  The patient will be indep with initial HEP Baseline: initiated at eval Goal status: MET  2.  The patient will report pain in bilateral feet < or equal to 2/10. Baseline:  4/10 Goal status: NOT MET  LONG TERM GOALS: Target date: 11/18/23  The  patient will be indep with progression of HEP. Baseline:  initiated at eval Goal status: INITIAL  2.  The patient will improve miniBEST to > or equal to 19/28. Baseline: 15/28 Goal status: INITIAL  3.  The patient will improve gait speed to > or equal to 2.6 ft/sec Baseline: 2.16 ft/sec Goal status: INITIAL  4.  The patient will verbalize understanding of strategies to reduce freezing episodes during gait. Baseline:  R foot heavy and pt reports he cannot lift it Goal status: INITIAL  5.  The patient will maintain single limb standing on R side x 8 seconds. Baseline:  2 seconds Goal status: INITIAL  6.  The patient will be indep with post d/c PWR exercise and have list of community resources. Baseline:  discussed at eval Goal status: INITIAL  ASSESSMENT:  CLINICAL IMPRESSION: Session focused on high-amplitude movement and multi-directional stepping with oppositional reaching. Close SBA provided with occasional CGA provided due to unsteadiness.   OBJECTIVE IMPAIRMENTS: Abnormal gait, decreased activity tolerance, decreased balance, decreased coordination, difficulty walking, impaired flexibility, and pain.    PLAN:  PT FREQUENCY: 2x/week  PT DURATION: 8 weeks  PLANNED INTERVENTIONS: 97164- PT Re-evaluation, 97110-Therapeutic exercises, 97530- Therapeutic activity, 97112- Neuromuscular re-education, 97535- Self Care, 16109- Manual therapy, [phone]- Gait training, Patient/Family education, Balance training, Taping, and DME instructions  PLAN FOR NEXT SESSION:  CHECK GAIT SPEED AND mini BEST PWR! Exercises considering R foot placement, work on tips to reduce freezing with gait, Ankle stability, bed mobility Flint Hummer, PTA 10/24/2023, 11:04 AM

## 2023-10-28 ENCOUNTER — Ambulatory Visit: Admitting: Physical Therapy

## 2023-10-28 ENCOUNTER — Encounter: Payer: Self-pay | Admitting: Physical Therapy

## 2023-10-28 DIAGNOSIS — R29818 Other symptoms and signs involving the nervous system: Secondary | ICD-10-CM | POA: Diagnosis not present

## 2023-10-28 DIAGNOSIS — R2689 Other abnormalities of gait and mobility: Secondary | ICD-10-CM | POA: Diagnosis not present

## 2023-10-28 DIAGNOSIS — M25571 Pain in right ankle and joints of right foot: Secondary | ICD-10-CM | POA: Diagnosis not present

## 2023-10-28 DIAGNOSIS — M6281 Muscle weakness (generalized): Secondary | ICD-10-CM | POA: Diagnosis not present

## 2023-10-28 DIAGNOSIS — R2681 Unsteadiness on feet: Secondary | ICD-10-CM | POA: Diagnosis not present

## 2023-10-28 NOTE — Therapy (Signed)
 OUTPATIENT PHYSICAL THERAPY TREATMENT  Patient Name: Marc Reynolds MRN: [id number] DOB:Aug 25, 1950, 73 y.o., male Today's Date: 10/28/2023 Dates of service: 09/19/23-10/28/23 END OF SESSION:  PT End of Session - 10/28/23 1537     Visit Number 10    Number of Visits 16    Date for PT Re-Evaluation 11/18/23    Authorization Type [employer] Medicare    Authorization Time Period 16 approved through 11/14/23    Authorization - Visit Number 10    Authorization - Number of Visits 16    Progress Note Due on Visit 20    PT Start Time 1445    PT Stop Time 1530    PT Time Calculation (min) 45 min    Activity Tolerance Patient tolerated treatment well    Behavior During Therapy [facility] for tasks assessed/performed             Past Medical History:  Diagnosis Date   Anxiety    Cancer (HCC)    Prostate 2022   Depression    Neuromuscular disorder (HCC)    Parkinson's 2021   Varicose veins of legs    History reviewed. No pertinent surgical history. Patient Active Problem List   Diagnosis Date Noted   Left foot pain 11/07/2022   Visit for suture removal 10/15/2022   Primary osteoarthritis of left knee 08/30/2022   Malignant neoplasm of prostate (HCC) 12/04/2020   Well adult exam 12/14/2019   Resting tremor 12/14/2019   Parkinsonism (HCC) 05/01/2018   Hypertriglyceridemia 09/11/2015   Leukocytosis 09/11/2015   Anxiety and depression 08/11/2015   BPH (benign prostatic hyperplasia) 08/11/2015   PCP: Adela Holter REFERRING PROVIDER: Carlton Chick, MD  REFERRING DIAG: peroneal tendonitis G20.C (ICD-10-CM) - Parkinsonism, unspecified  THERAPY DIAG:  Other abnormalities of gait and mobility  Pain in right ankle and joints of right foot  Rationale for Evaluation and Treatment: Rehabilitation  ONSET DATE: 09/01/23  SUBJECTIVE:   SUBJECTIVE STATEMENT: Patient reports he has less soreness on the side of his foot but still feels the ankle turning out. He got new shoes and is  wearing ankle brace  PERTINENT HISTORY: Parkinson's Disease, prostate cancer, anxiety, depression  The patient reports his feet still hurt and he continues with foot turning in (walking in inversion and "it pulls on my ankle").  The bilateral foot issue is fairly new and he notes pain on both sides. Pt states he still feels like his ankle is "turning in " when he is on uneven surfaces. He has been wearing an ankle brace and states this helps. For Parkinsonism, he notes tremors. He has not had a fall in one year.  Underwent med changes with neurologist in the last week. He is on levadopa 2mg  3x/day  PAIN:  Are you having pain? Yes: NPRS scale: 4/10 at worst Pain location: Lt lateral foot Pain description: burning/sharp Aggravating factors: standing or walking for prolonged time Relieving factors: rest, meds *kept him awake last night and ibuprofen helped  PRECAUTIONS: Fall  WEIGHT BEARING RESTRICTIONS: No  FALLS:  Has patient fallen in last 6 months? No  PATIENT GOALS: reduce pain  OBJECTIVE:  Note: Objective measures were completed at Evaluation unless otherwise noted. COGNITION: Overall cognitive status: Within functional limits for tasks assessed   COORDINATION: Overall diminished coordination   MUSCLE LENGTH: Hamstrings: tight hamstrings noted bilaterally with MMT knee extension  POSTURE: weight shifted more to L LE with narrow base of support and mild ankle inversion R  LOWER EXTREMITY ROM:  WFLs  LOWER EXTREMITY MMT:   MMT Right Eval Left Eval  Hip flexion 5/5 5/5  Hip extension    Hip abduction    Hip adduction    Hip internal rotation    Hip external rotation    Knee flexion 5/5 5/5  Knee extension 5/5 5/5  Ankle dorsiflexion 3+/5 4/5  Ankle plantarflexion    Ankle inversion    Ankle eversion    (Blank rows = not tested)  BED MOBILITY:  independent  TRANSFERS: Assistive device utilized: None  Sit to stand: Complete Independence Stand to sit:  Complete Independence  STAIRS: Comments: TBA  GAIT: Gait pattern: decreased arm swing- Right, decreased arm swing- Left, decreased stance time- Right, decreased stride length, decreased trunk rotation, and poor foot clearance- Right Distance walked: 126ft Assistive device utilized: None Level of assistance: Modified independence Comments: gait speed=2.16 ft/sec  FUNCTIONAL TESTS:  5 times sit to stand: 11.83 seconds Timed up and go (TUG): 9.96 seconds Berg Balance Scale: 48/56 Mini Best Test: 15/28   [facility] Adult PT Treatment:                                                DATE: 10/28/23  Neuromuscular re-ed: Mini Best 19/28 Standing on incline board EO with head turns Standing balance in corner EC EC with head turns Tandem stance EC Narrow BOS EC with head turns  Gait: Gait speed test: 3.04 ft/sec    [facility] Adult PT Treatment:                                                DATE: 10/24/2023 Therapeutic Exercise: Supine:  PPT + breathing cues Bridges + ball b/w knees Core marching S/L open books Standing: Heel raises with ball b/w ankles Toe raises with ball b/w ankles Neuromuscular re-ed: Big lateral step out with arm reach Cross body reaching  Colored dot foot tap --> step out with weight shifting Marching + opp knee taps Marching + opp arm reach up Therapeutic Activity: Seated marching + opp arm overhead reach STS --> focus on anterior weight shifting, arm reaching forward STS --> hands placed on therapists for fwd weight shift cue and UE support for balance Standing marching + opp arm overhead reach   [facility] Adult PT Treatment:                                                DATE: 10/22/23 Neuromuscular re-ed: PWR! Stepping with resistance - 10# fwd/laterally Tap ups 8'' step with 2 kg med ball lift initially Rt only - focus on Lt LE stability, then alternating focus on coordination with CGA for balance Coordination tapping cones with alternating LEs - CGA for  balance, increased time for LT LE coordination Dribbling 2kg medicine ball with alternating feet focusing on Lt LE coordination Dribble 2kg med ball through goal with continued focus on coordination     PATIENT EDUCATION: Education details: HEP Person educated: Patient Education method: Explanation, Demonstration, and Handouts Education comprehension: verbalized understanding, returned demonstration, and needs further education  HOME EXERCISE PROGRAM: Access Code:  URL: [email or web address] Date: 10/17/2023 Prepared by: Lowery Rue  Exercises - Supine Bridge  - 1 x daily - 7 x weekly - 3 sets - 10 reps - Supine Posterior Pelvic Tilt  - 1 x daily - 7 x weekly - 3 sets - 10 reps - Seated Pelvic Tilt  - 1 x daily - 7 x weekly - 3 sets - 10 reps    GOALS: Goals reviewed with patient? Yes  SHORT TERM GOALS: Target date: 10/18/23  The patient will be indep with initial HEP Baseline: initiated at eval Goal status: MET  2.  The patient will report pain in bilateral feet < or equal to 2/10. Baseline:  4/10 Goal status: NOT MET  LONG TERM GOALS: Target date: 11/18/23  The patient will be indep with progression of HEP. Baseline:  initiated at eval Goal status: INITIAL  2.  The patient will improve miniBEST to > or equal to 19/28. Baseline: 15/28 Goal status: MET  3.  The patient will improve gait speed to > or equal to 2.6 ft/sec Baseline: 2.16 ft/sec Goal status: MET  4.  The patient will verbalize understanding of strategies to reduce freezing episodes during gait. Baseline:  R foot heavy and pt reports he cannot lift it Goal status: INITIAL  5.  The patient will maintain single limb standing on R side x 8 seconds. Baseline:  2 seconds Goal status: INITIAL  6.  The patient will be indep with post d/c PWR exercise and have list of community resources. Baseline:  discussed at eval Goal status: INITIAL  ASSESSMENT:  CLINICAL  IMPRESSION: Pt has improved gait speed and mini BEST score and has met these LTGs. He had most difficulty during balance with EC and on incline surface so these deficits were addressed during treatment session. Pt does have increased ankle inversion/supination when fatigued. Pt will continue to benefit from skilled PT to address remaining deficits and reduce risk of falls.  OBJECTIVE IMPAIRMENTS: Abnormal gait, decreased activity tolerance, decreased balance, decreased coordination, difficulty walking, impaired flexibility, and pain.    PLAN:  PT FREQUENCY: 2x/week  PT DURATION: 8 weeks  PLANNED INTERVENTIONS: 97164- PT Re-evaluation, 97110-Therapeutic exercises, 97530- Therapeutic activity, [id number]- Neuromuscular re-education, 97535- Self Care, 91478- Manual therapy, [phone]- Gait training, Patient/Family education, Balance training, Taping, and DME instructions  PLAN FOR NEXT SESSION:  update HEP PWR! Exercises considering R foot placement, work on tips to reduce freezing with gait, Ankle stability, bed mobility Nicasio Barlowe, PT 10/28/2023, 3:38 PM

## 2023-11-04 ENCOUNTER — Encounter: Payer: Self-pay | Admitting: Physical Therapy

## 2023-11-04 ENCOUNTER — Ambulatory Visit: Admitting: Physical Therapy

## 2023-11-04 DIAGNOSIS — R2689 Other abnormalities of gait and mobility: Secondary | ICD-10-CM

## 2023-11-04 DIAGNOSIS — M6281 Muscle weakness (generalized): Secondary | ICD-10-CM | POA: Diagnosis not present

## 2023-11-04 DIAGNOSIS — M25571 Pain in right ankle and joints of right foot: Secondary | ICD-10-CM | POA: Diagnosis not present

## 2023-11-04 DIAGNOSIS — R2681 Unsteadiness on feet: Secondary | ICD-10-CM | POA: Diagnosis not present

## 2023-11-04 DIAGNOSIS — R29818 Other symptoms and signs involving the nervous system: Secondary | ICD-10-CM | POA: Diagnosis not present

## 2023-11-04 NOTE — Therapy (Signed)
 OUTPATIENT PHYSICAL THERAPY TREATMENT  Patient Name: Marc Reynolds MRN: [id number] DOB:[birth year], 73 y.o., male Today's Date: 11/04/2023 Dates of service: 09/19/23-10/28/23 END OF SESSION:  PT End of Session - 11/04/23 1445     Visit Number 11    Number of Visits 16    Date for PT Re-Evaluation 11/18/23    Authorization Type [employer] Medicare    Authorization Time Period 16 approved through 11/14/23    Authorization - Visit Number 11    Authorization - Number of Visits 16    Progress Note Due on Visit 20    PT Start Time 1400    PT Stop Time 1445    PT Time Calculation (min) 45 min    Activity Tolerance Patient tolerated treatment well    Behavior During Therapy [facility] for tasks assessed/performed              Past Medical History:  Diagnosis Date   Anxiety    Cancer (HCC)    Prostate 2022   Depression    Neuromuscular disorder (HCC)    Parkinson's 2021   Varicose veins of legs    History reviewed. No pertinent surgical history. Patient Active Problem List   Diagnosis Date Noted   Left foot pain 11/07/2022   Visit for suture removal 10/15/2022   Primary osteoarthritis of left knee 08/30/2022   Malignant neoplasm of prostate (HCC) 12/04/2020   Well adult exam 12/14/2019   Resting tremor 12/14/2019   Parkinsonism (HCC) 05/01/2018   Hypertriglyceridemia 09/11/2015   Leukocytosis 09/11/2015   Anxiety and depression 08/11/2015   BPH (benign prostatic hyperplasia) 08/11/2015   PCP: Adela Holter REFERRING PROVIDER: Carlton Chick, MD  REFERRING DIAG: peroneal tendonitis G20.C (ICD-10-CM) - Parkinsonism, unspecified  THERAPY DIAG:  Other abnormalities of gait and mobility  Pain in right ankle and joints of right foot  Rationale for Evaluation and Treatment: Rehabilitation  ONSET DATE: 09/01/23  SUBJECTIVE:   SUBJECTIVE STATEMENT: Patient reports he still has the sore spot on his foot. His ankle continues to "twist" when fatigued  PERTINENT  HISTORY: Parkinson's Disease, prostate cancer, anxiety, depression  The patient reports his feet still hurt and he continues with foot turning in (walking in inversion and "it pulls on my ankle").  The bilateral foot issue is fairly new and he notes pain on both sides. Pt states he still feels like his ankle is "turning in " when he is on uneven surfaces. He has been wearing an ankle brace and states this helps. For Parkinsonism, he notes tremors. He has not had a fall in one year.  Underwent med changes with neurologist in the last week. He is on levadopa 2mg  3x/day  PAIN:  Are you having pain? Yes: NPRS scale: 4/10 at worst Pain location: Lt lateral foot Pain description: burning/sharp Aggravating factors: standing or walking for prolonged time Relieving factors: rest, meds *kept him awake last night and ibuprofen helped  PRECAUTIONS: Fall  WEIGHT BEARING RESTRICTIONS: No  FALLS:  Has patient fallen in last 6 months? No  PATIENT GOALS: reduce pain  OBJECTIVE:  Note: Objective measures were completed at Evaluation unless otherwise noted. COGNITION: Overall cognitive status: Within functional limits for tasks assessed   COORDINATION: Overall diminished coordination   MUSCLE LENGTH: Hamstrings: tight hamstrings noted bilaterally with MMT knee extension  POSTURE: weight shifted more to L LE with narrow base of support and mild ankle inversion R  LOWER EXTREMITY ROM:    WFLs  LOWER EXTREMITY MMT:   MMT  Right Eval Left Eval  Hip flexion 5/5 5/5  Hip extension    Hip abduction    Hip adduction    Hip internal rotation    Hip external rotation    Knee flexion 5/5 5/5  Knee extension 5/5 5/5  Ankle dorsiflexion 3+/5 4/5  Ankle plantarflexion    Ankle inversion    Ankle eversion    (Blank rows = not tested)  BED MOBILITY:  independent  TRANSFERS: Assistive device utilized: None  Sit to stand: Complete Independence Stand to sit: Complete  Independence  STAIRS: Comments: TBA  GAIT: Gait pattern: decreased arm swing- Right, decreased arm swing- Left, decreased stance time- Right, decreased stride length, decreased trunk rotation, and poor foot clearance- Right Distance walked: 122ft Assistive device utilized: None Level of assistance: Modified independence Comments: gait speed=2.16 ft/sec  FUNCTIONAL TESTS:  5 times sit to stand: 11.83 seconds Timed up and go (TUG): 9.96 seconds Berg Balance Scale: 48/56 Mini Best Test: 15/28   [facility] Adult PT Treatment:                                                DATE: 11/04/23  Neuromuscular re-ed: Gait on treadmill 2-4% grade at 1.50mph with cues for lifting feet to reduce shuffling Suitcase carry 10#KB with turns/curves to reduce shuffling ---> increased Rt ankle supination when fatigued Airex balance beam focus on neutral ankle Side step Tandem walk Corner balance EC EC with head turns, nods Tandem stance EC Narrow BOS EC Ankle inversion isometric    [facility] Adult PT Treatment:                                                DATE: 10/28/23  Neuromuscular re-ed: Mini Best 19/28 Standing on incline board EO with head turns Standing balance in corner EC EC with head turns Tandem stance EC Narrow BOS EC with head turns  Gait: Gait speed test: 3.04 ft/sec    [facility] Adult PT Treatment:                                                DATE: 10/24/2023 Therapeutic Exercise: Supine:  PPT + breathing cues Bridges + ball b/w knees Core marching S/L open books Standing: Heel raises with ball b/w ankles Toe raises with ball b/w ankles Neuromuscular re-ed: Big lateral step out with arm reach Cross body reaching  Colored dot foot tap --> step out with weight shifting Marching + opp knee taps Marching + opp arm reach up Therapeutic Activity: Seated marching + opp arm overhead reach STS --> focus on anterior weight shifting, arm reaching forward STS --> hands placed on  therapists for fwd weight shift cue and UE support for balance Standing marching + opp arm overhead reach   [facility] Adult PT Treatment:                                                DATE: 10/22/23 Neuromuscular re-ed:  PWR! Stepping with resistance - 10# fwd/laterally Tap ups 8'' step with 2 kg med ball lift initially Rt only - focus on Lt LE stability, then alternating focus on coordination with CGA for balance Coordination tapping cones with alternating LEs - CGA for balance, increased time for LT LE coordination Dribbling 2kg medicine ball with alternating feet focusing on Lt LE coordination Dribble 2kg med ball through goal with continued focus on coordination     PATIENT EDUCATION: Education details: HEP Person educated: Patient Education method: Explanation, Demonstration, and Handouts Education comprehension: verbalized understanding, returned demonstration, and needs further education  HOME EXERCISE PROGRAM: Access Code: URL: [email or web address] Date: 11/04/2023 Prepared by: Lowery Rue  Exercises - Supine Bridge  - 1 x daily - 7 x weekly - 3 sets - 10 reps - Supine Posterior Pelvic Tilt  - 1 x daily - 7 x weekly - 3 sets - 10 reps - Seated Pelvic Tilt  - 1 x daily - 7 x weekly - 3 sets - 10 reps - Isometric Ankle Inversion  - 1 x daily - 7 x weekly - 3 sets - 10 reps - 3 seconds hold - Tandem Walking Along Line  - 1 x daily - 7 x weekly - 1 sets - 10 reps - Standing Near Stance in Corner with Eyes Closed  - 1 x daily - 7 x weekly - 1 sets - 3 reps - 20 seconds hold - Tandem Stance with Eyes Closed in Corner  - 1 x daily - 7 x weekly - 1 sets - 3 reps - 20 seconds hold    GOALS: Goals reviewed with patient? Yes  SHORT TERM GOALS: Target date: 10/18/23  The patient will be indep with initial HEP Baseline: initiated at eval Goal status: MET  2.  The patient will report pain in bilateral feet < or equal to 2/10. Baseline:  4/10 Goal status:  NOT MET  LONG TERM GOALS: Target date: 11/18/23  The patient will be indep with progression of HEP. Baseline:  initiated at eval Goal status: INITIAL  2.  The patient will improve miniBEST to > or equal to 19/28. Baseline: 15/28 Goal status: MET  3.  The patient will improve gait speed to > or equal to 2.6 ft/sec Baseline: 2.16 ft/sec Goal status: MET  4.  The patient will verbalize understanding of strategies to reduce freezing episodes during gait. Baseline:  R foot heavy and pt reports he cannot lift it Goal status: INITIAL  5.  The patient will maintain single limb standing on R side x 8 seconds. Baseline:  2 seconds Goal status: INITIAL  6.  The patient will be indep with post d/c PWR exercise and have list of community resources. Baseline:  discussed at eval Goal status: INITIAL  ASSESSMENT:  CLINICAL IMPRESSION: Continued to focus on Rt ankle stability during functional gait and standing activities. Pt continues with significant supination when fatigued. Updated HEP to include balance and ankle stability exercises  OBJECTIVE IMPAIRMENTS: Abnormal gait, decreased activity tolerance, decreased balance, decreased coordination, difficulty walking, impaired flexibility, and pain.    PLAN:  PT FREQUENCY: 2x/week  PT DURATION: 8 weeks  PLANNED INTERVENTIONS: 97164- PT Re-evaluation, 97110-Therapeutic exercises, 97530- Therapeutic activity, [id number]- Neuromuscular re-education, 97535- Self Care, 16109- Manual therapy, [phone]- Gait training, Patient/Family education, Balance training, Taping, and DME instructions  PLAN FOR NEXT SESSION:  PWR! Exercises considering R foot placement, work on tips to reduce freezing with gait, Ankle stability, bed mobility Sterling Mondo, PT 11/04/2023,  2:45 PM

## 2023-11-05 NOTE — Therapy (Addendum)
 OUTPATIENT PHYSICAL THERAPY TREATMENT PHYSICAL THERAPY DISCHARGE SUMMARY  Visits from Start of Care: 12  Current functional level related to goals / functional outcomes: See progress note for discharge status    Remaining deficits: Unknown    Education / Equipment: Needs to continue with HEP    Patient agrees to discharge. Patient goals were partially met. Patient is being discharged due to not returning since the last visit.  Marc Reynolds PT, MPH 03/11/24 9:47 AM For Marc Reynolds, PT   Patient Name: Marc Reynolds MRN: [id number] DOB:05-08-1951, 73 y.o., male Today's Date: 11/06/2023 Dates of service: 09/19/23-10/28/23 END OF SESSION:  PT End of Session - 11/06/23 1102     Visit Number 12    Number of Visits 16    Date for PT Re-Evaluation 11/18/23    Authorization Type [employer] Medicare    Authorization Time Period 16 approved through 11/14/23    Authorization - Visit Number 12    Authorization - Number of Visits 16    Progress Note Due on Visit 20    PT Start Time 1102    PT Stop Time 1142    PT Time Calculation (min) 40 min    Activity Tolerance Patient tolerated treatment well    Behavior During Therapy [facility] for tasks assessed/performed               Past Medical History:  Diagnosis Date   Anxiety    Cancer (HCC)    Prostate 2022   Depression    Neuromuscular disorder (HCC)    Parkinson's 2021   Varicose veins of legs    History reviewed. No pertinent surgical history. Patient Active Problem List   Diagnosis Date Noted   Left foot pain 11/07/2022   Visit for suture removal 10/15/2022   Primary osteoarthritis of left knee 08/30/2022   Malignant neoplasm of prostate (HCC) 12/04/2020   Well adult exam 12/14/2019   Resting tremor 12/14/2019   Parkinsonism (HCC) 05/01/2018   Hypertriglyceridemia 09/11/2015   Leukocytosis 09/11/2015   Anxiety and depression 08/11/2015   BPH (benign prostatic hyperplasia) 08/11/2015   PCP: Alvia Bring REFERRING  PROVIDER: Hans Cinderella Duck, MD  REFERRING DIAG: peroneal tendonitis G20.C (ICD-10-CM) - Parkinsonism, unspecified  THERAPY DIAG:  Other abnormalities of gait and mobility  Pain in right ankle and joints of right foot  Unsteadiness on feet  Other symptoms and signs involving the nervous system  Muscle weakness (generalized)  Rationale for Evaluation and Treatment: Rehabilitation  ONSET DATE: 09/01/23  SUBJECTIVE:   SUBJECTIVE STATEMENT:  A little pain today.  PERTINENT HISTORY: Parkinson's Disease, prostate cancer, anxiety, depression  The patient reports his feet still hurt and he continues with foot turning in (walking in inversion and it pulls on my ankle).  The bilateral foot issue is fairly new and he notes pain on both sides. Pt states he still feels like his ankle is turning in  when he is on uneven surfaces. He has been wearing an ankle brace and states this helps. For Parkinsonism, he notes tremors. He has not had a fall in one year.  Underwent med changes with neurologist in the last week. He is on levadopa 2mg  3x/day  PAIN:  Are you having pain? Yes: NPRS scale: 4/10 at worst Pain location: Lt lateral foot Pain description: burning/sharp Aggravating factors: standing or walking for prolonged time Relieving factors: rest, meds *kept him awake last night and ibuprofen helped  PRECAUTIONS: Fall  WEIGHT BEARING RESTRICTIONS: No  FALLS:  Has patient fallen in last 6 months? No  PATIENT GOALS: reduce pain  OBJECTIVE:  Note: Objective measures were completed at Evaluation unless otherwise noted. COGNITION: Overall cognitive status: Within functional limits for tasks assessed   COORDINATION: Overall diminished coordination   MUSCLE LENGTH: Hamstrings: tight hamstrings noted bilaterally with MMT knee extension  POSTURE: weight shifted more to L LE with narrow base of support and mild ankle inversion R  LOWER EXTREMITY ROM:    WFLs  LOWER  EXTREMITY MMT:   MMT Right Eval Left Eval  Hip flexion 5/5 5/5  Hip extension    Hip abduction    Hip adduction    Hip internal rotation    Hip external rotation    Knee flexion 5/5 5/5  Knee extension 5/5 5/5  Ankle dorsiflexion 3+/5 4/5  Ankle plantarflexion    Ankle inversion    Ankle eversion    (Blank rows = not tested)  BED MOBILITY:  independent  TRANSFERS: Assistive device utilized: None  Sit to stand: Complete Independence Stand to sit: Complete Independence  STAIRS: Comments: TBA  GAIT: Gait pattern: decreased arm swing- Right, decreased arm swing- Left, decreased stance time- Right, decreased stride length, decreased trunk rotation, and poor foot clearance- Right Distance walked: 123ft Assistive device utilized: None Level of assistance: Modified independence Comments: gait speed=2.16 ft/sec  FUNCTIONAL TESTS:  5 times sit to stand: 11.83 seconds Timed up and go (TUG): 9.96 seconds Berg Balance Scale: 48/56 Mini Best Test: 15/28   [facility] Adult PT Treatment:                                                DATE: 11/06/23  Neuromuscular re-ed: Gait on treadmill 2-4% grade at 1.63mph with cues for lifting feet to reduce shuffling Suitcase carry 10#KB with turns/curves to reduce shuffling ---> increased Rt ankle supination when fatigued Heel walking 2x82ft Gait with metronome - cues for arm swing to increase step length ---> vocalized right/left/right/left with gait to mimic metronome --> then pt walking with just thinking R/L in head Airex balance beam focus on neutral ankle Side step  Tandem walk Corner balance EC EC with head turns, nods Tandem stance EC - too challenging today so did semi tandem Narrow BOS EC Step to dots with color call out B encouraging larger steps and weight shift to stepping leg   [facility] Adult PT Treatment:                                                DATE: 11/04/23  Neuromuscular re-ed: Gait on treadmill 2-4% grade at 1.22mph  with cues for lifting feet to reduce shuffling Suitcase carry 10#KB with turns/curves to reduce shuffling ---> increased Rt ankle supination when fatigued Airex balance beam focus on neutral ankle Side step Tandem walk Corner balance EC EC with head turns, nods Tandem stance EC  Narrow BOS EC Ankle inversion isometric    [facility] Adult PT Treatment:  DATE: 10/28/23  Neuromuscular re-ed: Mini Best 19/28 Standing on incline board EO with head turns Standing balance in corner EC EC with head turns Tandem stance EC Narrow BOS EC with head turns  Gait: Gait speed test: 3.04 ft/sec    [facility] Adult PT Treatment:                                                DATE: 10/24/2023 Therapeutic Exercise: Supine:  PPT + breathing cues Bridges + ball b/w knees Core marching S/L open books Standing: Heel raises with ball b/w ankles Toe raises with ball b/w ankles Neuromuscular re-ed: Big lateral step out with arm reach Cross body reaching  Colored dot foot tap --> step out with weight shifting Marching + opp knee taps Marching + opp arm reach up Therapeutic Activity: Seated marching + opp arm overhead reach STS --> focus on anterior weight shifting, arm reaching forward STS --> hands placed on therapists for fwd weight shift cue and UE support for balance Standing marching + opp arm overhead reach   [facility] Adult PT Treatment:                                                DATE: 10/22/23 Neuromuscular re-ed: PWR! Stepping with resistance - 10# fwd/laterally Tap ups 8'' step with 2 kg med ball lift initially Rt only - focus on Lt LE stability, then alternating focus on coordination with CGA for balance Coordination tapping cones with alternating LEs - CGA for balance, increased time for LT LE coordination Dribbling 2kg medicine ball with alternating feet focusing on Lt LE coordination Dribble 2kg med ball through goal with continued focus on  coordination     PATIENT EDUCATION: Education details: HEP Person educated: Patient Education method: Explanation, Demonstration, and Handouts Education comprehension: verbalized understanding, returned demonstration, and needs further education  HOME EXERCISE PROGRAM: Access Code: URL: [email or web address] Date: 11/04/2023 Prepared by: Darice Conine  Exercises - Supine Bridge  - 1 x daily - 7 x weekly - 3 sets - 10 reps - Supine Posterior Pelvic Tilt  - 1 x daily - 7 x weekly - 3 sets - 10 reps - Seated Pelvic Tilt  - 1 x daily - 7 x weekly - 3 sets - 10 reps - Isometric Ankle Inversion  - 1 x daily - 7 x weekly - 3 sets - 10 reps - 3 seconds hold - Tandem Walking Along Line  - 1 x daily - 7 x weekly - 1 sets - 10 reps - Standing Near Stance in Corner with Eyes Closed  - 1 x daily - 7 x weekly - 1 sets - 3 reps - 20 seconds hold - Tandem Stance with Eyes Closed in Corner  - 1 x daily - 7 x weekly - 1 sets - 3 reps - 20 seconds hold    GOALS: Goals reviewed with patient? Yes  SHORT TERM GOALS: Target date: 10/18/23  The patient will be indep with initial HEP Baseline: initiated at eval Goal status: MET  2.  The patient will report pain in bilateral feet < or equal to 2/10. Baseline:  4/10 Goal status: NOT MET  LONG TERM GOALS: Target date: 11/18/23  The  patient will be indep with progression of HEP. Baseline:  initiated at eval Goal status: INITIAL  2.  The patient will improve miniBEST to > or equal to 19/28. Baseline: 15/28 Goal status: MET  3.  The patient will improve gait speed to > or equal to 2.6 ft/sec Baseline: 2.16 ft/sec Goal status: MET  4.  The patient will verbalize understanding of strategies to reduce freezing episodes during gait. Baseline:  R foot heavy and pt reports he cannot lift it Goal status: INITIAL  5.  The patient will maintain single limb standing on R side x 8 seconds. Baseline:  2 seconds Goal status:  INITIAL  6.  The patient will be indep with post d/c PWR exercise and have list of community resources. Baseline:  discussed at eval Goal status: INITIAL  ASSESSMENT:  CLINICAL IMPRESSION: Patient did very well with gait when verbalizing Right/Left and adding arm swing. He had difficulty today on the TM secondary to foot pain. His balance was more challenging today both on the balance bealm and in corner exercises. He required UE support with balance beam. Pt encouraged to use verbal cues to normalize gait when walking independently.  OBJECTIVE IMPAIRMENTS: Abnormal gait, decreased activity tolerance, decreased balance, decreased coordination, difficulty walking, impaired flexibility, and pain.    PLAN:  PT FREQUENCY: 2x/week  PT DURATION: 8 weeks  PLANNED INTERVENTIONS: 97164- PT Re-evaluation, 97110-Therapeutic exercises, 97530- Therapeutic activity, [id number]- Neuromuscular re-education, 97535- Self Care, [id number]- Manual therapy, [id number]- Gait training, Patient/Family education, Balance training, Taping, and DME instructions  PLAN FOR NEXT SESSION:  PWR! Exercises considering R foot placement, work on tips to reduce freezing with gait, Ankle stability, bed mobility  Marc Reynolds, PT  11/06/2023, 11:48 AM

## 2023-11-06 ENCOUNTER — Ambulatory Visit: Admitting: Physical Therapy

## 2023-11-06 ENCOUNTER — Encounter: Payer: Self-pay | Admitting: Physical Therapy

## 2023-11-06 DIAGNOSIS — R29818 Other symptoms and signs involving the nervous system: Secondary | ICD-10-CM

## 2023-11-06 DIAGNOSIS — M25571 Pain in right ankle and joints of right foot: Secondary | ICD-10-CM

## 2023-11-06 DIAGNOSIS — R2689 Other abnormalities of gait and mobility: Secondary | ICD-10-CM

## 2023-11-06 DIAGNOSIS — R2681 Unsteadiness on feet: Secondary | ICD-10-CM

## 2023-11-06 DIAGNOSIS — M6281 Muscle weakness (generalized): Secondary | ICD-10-CM | POA: Diagnosis not present

## 2023-11-18 ENCOUNTER — Encounter: Payer: Self-pay | Admitting: Podiatry

## 2023-11-18 ENCOUNTER — Ambulatory Visit

## 2023-11-18 ENCOUNTER — Ambulatory Visit: Admitting: Podiatry

## 2023-11-18 DIAGNOSIS — G20C Parkinsonism, unspecified: Secondary | ICD-10-CM | POA: Diagnosis not present

## 2023-11-18 DIAGNOSIS — D2372 Other benign neoplasm of skin of left lower limb, including hip: Secondary | ICD-10-CM

## 2023-11-18 DIAGNOSIS — B351 Tinea unguium: Secondary | ICD-10-CM

## 2023-11-18 DIAGNOSIS — M7671 Peroneal tendinitis, right leg: Secondary | ICD-10-CM

## 2023-11-18 NOTE — Progress Notes (Signed)
  Subjective:  Patient ID: Marc Reynolds, male    DOB: May 13, 1951,   MRN: [id number]  Chief Complaint  Patient presents with   Callouses    Patient states that he has pain on the bottom of his left foot under his 5th toe. Patient states that it hurts to walk and put any pressure on his left foot. This has been going on for a few months now. And patient is taking ibuprofen  medication for pain    73 y.o. male presents for follow-up of  fungal nails and  right ankle pain. . Relates doing better states the fungus is much better and ankle doing well with brace. Has finished Pt. He relates new concern of pain on the left foot in area of lesion on the bottom of the foot. History of parkinson's.  . Denies any other pedal complaints. Denies n/v/f/c.   Past Medical History:  Diagnosis Date   Anxiety    Cancer [facility])    Prostate 2022   Depression    Neuromuscular disorder (HCC)    Parkinson's 2021   Varicose veins of legs     Objective:  Physical Exam: Vascular: DP/PT pulses 2/4 bilateral. CFT <3 seconds. Normal hair growth on digits. No edema.  Skin. No lacerations or abrasions bilateral feet. Nails 1-5 bilateral distally with subungual debris and thickness noted. Improved and 3/4 of nail cleared. Hyperkeratotic cored lesion noted to plantar left fifth metatarsal with disruption of skin lines.  Musculoskeletal: MMT 5/5 bilateral lower extremities in DF, PF, Inversion and Eversion. Deceased ROM in DF of ankle joint. Mildly tender over the anterior ankle joint line on the right as well as along the course of the peroneal tendon posterior to the lateral malleolus. Pain with PF and eversion and inversion. Mile pain with Rom of the ankle joint. No pain with ROM of the subtalar joint.  Neurological: Sensation intact to light touch.   Assessment:   1. Onychomycosis   2. Benign neoplasm of skin of foot, left   3. Peroneal tendonitis, right   4. Primary parkinsonism (HCC)        Plan:  Patient was  evaluated and treated and all questions answered. X-rays reviewed and discussed with patient. No acute fractures or dislocations noted.  Some degnerative changes noted in right ankle and subtalar joint.   Discussed peroneal tendinitis and capuslitis of the ankle  and treatment options at length with patient Continue brace and strethcing and anti-infalmmatories  Hyperkeratotic lesion debrided as courtesy today.  Fungus appears improved and no need for further treatment today.  Patient to return as needed    Jennefer Moats, DPM

## 2023-11-25 DIAGNOSIS — M50322 Other cervical disc degeneration at C5-C6 level: Secondary | ICD-10-CM | POA: Diagnosis not present

## 2023-11-25 DIAGNOSIS — S0990XA Unspecified injury of head, initial encounter: Secondary | ICD-10-CM | POA: Diagnosis not present

## 2023-11-25 DIAGNOSIS — S0081XA Abrasion of other part of head, initial encounter: Secondary | ICD-10-CM | POA: Diagnosis not present

## 2023-11-25 DIAGNOSIS — M50323 Other cervical disc degeneration at C6-C7 level: Secondary | ICD-10-CM | POA: Diagnosis not present

## 2023-11-25 DIAGNOSIS — M47812 Spondylosis without myelopathy or radiculopathy, cervical region: Secondary | ICD-10-CM | POA: Diagnosis not present

## 2023-11-25 DIAGNOSIS — Z87891 Personal history of nicotine dependence: Secondary | ICD-10-CM | POA: Diagnosis not present

## 2023-11-25 DIAGNOSIS — W19XXXA Unspecified fall, initial encounter: Secondary | ICD-10-CM | POA: Diagnosis not present

## 2023-11-25 DIAGNOSIS — M5033 Other cervical disc degeneration, cervicothoracic region: Secondary | ICD-10-CM | POA: Diagnosis not present

## 2023-11-25 DIAGNOSIS — S0993XA Unspecified injury of face, initial encounter: Secondary | ICD-10-CM | POA: Diagnosis not present

## 2023-12-01 ENCOUNTER — Ambulatory Visit: Payer: Self-pay

## 2023-12-01 ENCOUNTER — Ambulatory Visit (INDEPENDENT_AMBULATORY_CARE_PROVIDER_SITE_OTHER): Admitting: Family Medicine

## 2023-12-01 ENCOUNTER — Encounter: Payer: Self-pay | Admitting: Family Medicine

## 2023-12-01 VITALS — BP 141/78 | HR 73 | Ht 68.0 in | Wt 183.0 lb

## 2023-12-01 DIAGNOSIS — N401 Enlarged prostate with lower urinary tract symptoms: Secondary | ICD-10-CM

## 2023-12-01 DIAGNOSIS — K921 Melena: Secondary | ICD-10-CM | POA: Insufficient documentation

## 2023-12-01 DIAGNOSIS — R109 Unspecified abdominal pain: Secondary | ICD-10-CM

## 2023-12-01 DIAGNOSIS — R3915 Urgency of urination: Secondary | ICD-10-CM

## 2023-12-01 LAB — POCT URINALYSIS DIP (CLINITEK)
Bilirubin, UA: NEGATIVE
Blood, UA: NEGATIVE
Glucose, UA: NEGATIVE mg/dL
Ketones, POC UA: NEGATIVE mg/dL
Leukocytes, UA: NEGATIVE
Nitrite, UA: NEGATIVE
POC PROTEIN,UA: NEGATIVE
Spec Grav, UA: 1.015 (ref 1.010–1.025)
Urobilinogen, UA: 0.2 U/dL
pH, UA: 6 (ref 5.0–8.0)

## 2023-12-01 MED ORDER — TAMSULOSIN HCL 0.4 MG PO CAPS
0.4000 mg | ORAL_CAPSULE | Freq: Every day | ORAL | 3 refills | Status: AC
Start: 2023-12-01 — End: ?

## 2023-12-01 NOTE — Telephone Encounter (Signed)
 FYI - the patient is scheduled with the provider today at 410 pm.

## 2023-12-01 NOTE — Patient Instructions (Signed)
 Start flomax  (tamsulosin ) at night This is to hopefully help with the urinary frequency.  We may need to get some imaging based on the lab results.  We'll be in touch with these.

## 2023-12-01 NOTE — Progress Notes (Signed)
 [name] - 73 y.o. male MRN [id number]  Date of birth: 07-Jun-1951  Subjective Chief Complaint  Patient presents with   Flank Pain    HPI Marc Reynolds is a 73 year old male here today with complaint of right-sided flank/back pain as well as urinary frequency and urgency.  He does have a history of BPH.  Denies pain with urination.  He has not noted any blood in his urine.  He did have a fall about 1 week ago injuries to his face.  He was seen in the ED at that time.  He does report that he is having pain and urinary symptoms prior to his fall.  He did also note a small amount of blood when wiping after a bowel movement today.  He has had 3 other bowel movements since that time and has not noted any additional blood.  He does report having some constipation.  ROS:  A comprehensive ROS was completed and negative except as noted per HPI  No Known Allergies  Past Medical History:  Diagnosis Date   Anxiety    Cancer [facility])    Prostate 2022   Depression    Neuromuscular disorder (HCC)    Parkinson's 2021   Varicose veins of legs     History reviewed. No pertinent surgical history.  Social History   Socioeconomic History   Marital status: Married    Spouse name: June   Number of children: 3   Years of education: 16   Highest education level: Manufacturing engineer (e.g., MA, MS, MEng, MEd, MSW, MBA)  Occupational History   Occupation: Retired  Tobacco Use   Smoking status: Never   Smokeless tobacco: Never  Vaping Use   Vaping status: Never Used  Substance and Sexual Activity   Alcohol use: Yes    Alcohol/week: 2.0 standard drinks of alcohol    Types: 1 Glasses of wine, 1 Cans of beer per week    Comment: Drink occasionally.   Drug use: Never   Sexual activity: Not Currently    Partners: Female    Birth control/protection: None  Other Topics Concern   Not on file  Social History Narrative   Lives with his wife and daughter (she has down syndrome). He enjoys being outdoors.     Social Drivers of Corporate investment banker Strain: Low Risk  (09/03/2023)   Overall Financial Resource Strain (CARDIA)    Difficulty of Paying Living Expenses: Not hard at all  Food Insecurity: No Food Insecurity (09/03/2023)   Hunger Vital Sign    Worried About Running Out of Food in the Last Year: Never true    Ran Out of Food in the Last Year: Never true  Transportation Needs: No Transportation Needs (09/03/2023)   PRAPARE - Administrator, Civil Service (Medical): No    Lack of Transportation (Non-Medical): No  Physical Activity: Sufficiently Active (09/03/2023)   Exercise Vital Sign    Days of Exercise per Week: 5 days    Minutes of Exercise per Session: 30 min  Stress: Stress Concern Present (09/03/2023)   [employer] of Occupational Health - Occupational Stress Questionnaire    Feeling of Stress : To some extent  Social Connections: Socially Integrated (09/03/2023)   Social Connection and Isolation Panel    Frequency of Communication with Friends and Family: More than three times a week    Frequency of Social Gatherings with Friends and Family: Twice a week    Attends Religious Services: More than  4 times per year    Active Member of Clubs or Organizations: Yes    Attends Engineer, structural: More than 4 times per year    Marital Status: Married    Family History  Problem Relation Age of Onset   Pancreatic cancer Mother    Hypertension Mother    Diabetes Mother    Cancer Mother    Hypertension Father    Alcohol abuse Father    Arthritis Father    Varicose Veins Father    Skin cancer Sister    Hypertension Brother    Cancer Maternal Grandmother    ADD / ADHD Son    ADD / ADHD Son    Learning disabilities Son    Birth defects Daughter    Intellectual disability Daughter    Learning disabilities Daughter    Obesity Daughter    Hypertension Brother     Health Maintenance  Topic Date Due   Hepatitis C Screening  Never done    COVID-19 Vaccine (3 - Moderna risk series) 03/09/2020   Pneumococcal Vaccine: 50+ Years (2 of 2 - PPSV23) 12/13/2020   INFLUENZA VACCINE  01/16/2024   Medicare Annual Wellness (AWV)  09/02/2024   Fecal DNA (Cologuard)  04/06/2026   DTaP/Tdap/Td (3 - Td or Tdap) 10/08/2032   Zoster Vaccines- Shingrix  Completed   HPV VACCINES  Aged Out   Meningococcal B Vaccine  Aged Out     ----------------------------------------------------------------------------------------------------------------------------------------------------------------------------------------------------------------- Physical Exam BP (!) 141/78 (BP Location: Left Arm, Patient Position: Sitting, Cuff Size: Normal)   Pulse 73   Ht 5' 8 (1.727 m)   Wt 183 lb (83 kg)   SpO2 98%   BMI 27.83 kg/m   Physical Exam Constitutional:      Appearance: Normal appearance.   Eyes:     General: No scleral icterus.   Cardiovascular:     Rate and Rhythm: Normal rate and regular rhythm.  Pulmonary:     Effort: Pulmonary effort is normal.     Breath sounds: Normal breath sounds.  Abdominal:     General: Abdomen is flat. There is no distension.     Palpations: Abdomen is soft.     Tenderness: There is no abdominal tenderness. There is no right CVA tenderness, left CVA tenderness or rebound.   Neurological:     Mental Status: He is alert.   Psychiatric:        Mood and Affect: Mood normal.        Behavior: Behavior normal.     ------------------------------------------------------------------------------------------------------------------------------------------------------------------------------------------------------------------- Assessment and Plan  BPH (benign prostatic hyperplasia) Urinalysis is unremarkable.  Updating PSA to evaluate for possible prostatitis.  Will restart tamsulosin  to see if he has improvement in symptoms with this.  He did have some mild dizziness with this previously however blood pressure is  a little high today.  We did check labs today as well and discussed possible need for imaging if renal function is abnormal or other signs of potential obstruction.     Meds ordered this encounter  Medications   tamsulosin  (FLOMAX ) 0.4 MG CAPS capsule    Sig: Take 1 capsule (0.4 mg total) by mouth daily.    Dispense:  30 capsule    Refill:  3    No follow-ups on file.

## 2023-12-01 NOTE — Telephone Encounter (Signed)
 Copied from CRM [id number]. Topic: Clinical - Red Word Triage >> Dec 01, 2023 11:02 AM Carrielelia G wrote: Red Word that prompted transfer to Nurse Triage: rectal bleeding, frequent urination, back pain   FYI Only or Action Required?: FYI only for provider  Patient was last seen in primary care on 11/07/2022 by Adela Holter, DO. Called Nurse Triage reporting Rectal Bleeding. Symptoms began today. Interventions attempted: Nothing. Symptoms are: unchanged.  Triage Disposition: See PCP When Office is Open (Within 3 Days)  Patient/caregiver understands and will follow disposition?: Yes   Reason for Disposition  MILD rectal bleeding (more than just a few drops or streaks)  Answer Assessment - Initial Assessment Questions 1. APPEARANCE of BLOOD: What color is it? Is it passed separately, on the surface of the stool, or mixed in with the stool?      Bright red, mostly with wiping  2. AMOUNT: How much blood was passed?      Small amount  3. FREQUENCY: How many times has blood been passed with the stools?      One episode  4. ONSET: When was the blood first seen in the stools? (Days or weeks)      Today 5. DIARRHEA: Is there also some diarrhea? If Yes, ask: How many diarrhea stools in the past 24 hours?      No 6. CONSTIPATION: Do you have constipation? If Yes, ask: How bad is it?     Yes 7. RECURRENT SYMPTOMS: Have you had blood in your stools before? If Yes, ask: When was the last time? and What happened that time?      No 8. BLOOD THINNERS: Do you take any blood thinners? (e.g., Coumadin/warfarin, Pradaxa/dabigatran, aspirin)     No 9. OTHER SYMPTOMS: Do you have any other symptoms?  (e.g., abdomen pain, vomiting, dizziness, fever)     Frequent urination, back pain  Protocols used: Rectal Bleeding-A-AH

## 2023-12-01 NOTE — Assessment & Plan Note (Addendum)
 Urinalysis is unremarkable.  Updating PSA to evaluate for possible prostatitis.  Will restart tamsulosin  to see if he has improvement in symptoms with this.  He did have some mild dizziness with this previously however blood pressure is a little high today.  We did check labs today as well and discussed possible need for imaging if renal function is abnormal or other signs of potential obstruction.

## 2023-12-02 LAB — CMP14+EGFR
ALT: 21 IU/L (ref 0–44)
AST: 21 IU/L (ref 0–40)
Albumin: 4.5 g/dL (ref 3.8–4.8)
Alkaline Phosphatase: 114 IU/L (ref 44–121)
BUN/Creatinine Ratio: 22 (ref 10–24)
BUN: 19 mg/dL (ref 8–27)
Bilirubin Total: 0.2 mg/dL (ref 0.0–1.2)
CO2: 23 mmol/L (ref 20–29)
Calcium: 9.2 mg/dL (ref 8.6–10.2)
Chloride: 97 mmol/L (ref 96–106)
Creatinine, Ser: 0.88 mg/dL (ref 0.76–1.27)
Globulin, Total: 2 g/dL (ref 1.5–4.5)
Glucose: 66 mg/dL — ABNORMAL LOW (ref 70–99)
Potassium: 4 mmol/L (ref 3.5–5.2)
Sodium: 137 mmol/L (ref 134–144)
Total Protein: 6.5 g/dL (ref 6.0–8.5)
eGFR: 91 mL/min/{1.73_m2} (ref >59)

## 2023-12-02 LAB — CBC WITH DIFFERENTIAL/PLATELET
Basophils Absolute: 0.1 10*3/uL (ref 0.0–0.2)
Basos: 0 %
EOS (ABSOLUTE): 0.2 10*3/uL (ref 0.0–0.4)
Eos: 2 %
Hematocrit: 43.2 % (ref 37.5–51.0)
Hemoglobin: 14 g/dL (ref 13.0–17.7)
Immature Grans (Abs): 0.1 10*3/uL (ref 0.0–0.1)
Immature Granulocytes: 1 %
Lymphocytes Absolute: 1.6 10*3/uL (ref 0.7–3.1)
Lymphs: 14 %
MCH: 29.9 pg (ref 26.6–33.0)
MCHC: 32.4 g/dL (ref 31.5–35.7)
MCV: 92 fL (ref 79–97)
Monocytes Absolute: 0.6 10*3/uL (ref 0.1–0.9)
Monocytes: 5 %
Neutrophils Absolute: 9.1 10*3/uL — ABNORMAL HIGH (ref 1.4–7.0)
Neutrophils: 78 %
Platelets: 262 10*3/uL (ref 150–450)
RBC: 4.68 x10E6/uL (ref 4.14–5.80)
RDW: 13.5 % (ref 11.6–15.4)
WBC: 11.6 10*3/uL — ABNORMAL HIGH (ref 3.4–10.8)

## 2023-12-02 LAB — PSA: Prostate Specific Ag, Serum: 11.4 ng/mL — ABNORMAL HIGH (ref 0.0–4.0)

## 2023-12-03 LAB — URINE CULTURE

## 2023-12-04 ENCOUNTER — Ambulatory Visit: Payer: Self-pay | Admitting: Family Medicine

## 2023-12-04 MED ORDER — SULFAMETHOXAZOLE-TRIMETHOPRIM 800-160 MG PO TABS: 1.0000 | ORAL_TABLET | Freq: Two times a day (BID) | ORAL | 0 refills | Status: AC

## 2023-12-08 ENCOUNTER — Encounter: Payer: Self-pay | Admitting: Family Medicine

## 2023-12-08 DIAGNOSIS — R350 Frequency of micturition: Secondary | ICD-10-CM

## 2023-12-08 DIAGNOSIS — R109 Unspecified abdominal pain: Secondary | ICD-10-CM

## 2023-12-10 ENCOUNTER — Ambulatory Visit
Admission: RE | Admit: 2023-12-10 | Discharge: 2023-12-10 | Disposition: A | Discharge status: Home / Self Care | Source: Ambulatory Visit | Attending: Urology | Admitting: Urology

## 2023-12-10 DIAGNOSIS — C61 Malignant neoplasm of prostate: Secondary | ICD-10-CM

## 2023-12-10 MED ORDER — GADOPICLENOL 0.5 MMOL/ML IV SOLN
8.0000 mL | Freq: Once | INTRAVENOUS | Status: AC | PRN
  Administered 2023-12-10: 8 mL via INTRAVENOUS

## 2023-12-11 ENCOUNTER — Ambulatory Visit (INDEPENDENT_AMBULATORY_CARE_PROVIDER_SITE_OTHER)

## 2023-12-11 ENCOUNTER — Ambulatory Visit: Payer: Self-pay | Admitting: Family Medicine

## 2023-12-11 ENCOUNTER — Other Ambulatory Visit: Payer: Self-pay | Admitting: Family Medicine

## 2023-12-11 DIAGNOSIS — Z8546 Personal history of malignant neoplasm of prostate: Secondary | ICD-10-CM | POA: Diagnosis not present

## 2023-12-11 DIAGNOSIS — K769 Liver disease, unspecified: Secondary | ICD-10-CM

## 2023-12-11 DIAGNOSIS — R350 Frequency of micturition: Secondary | ICD-10-CM

## 2023-12-11 DIAGNOSIS — R160 Hepatomegaly, not elsewhere classified: Secondary | ICD-10-CM

## 2023-12-11 DIAGNOSIS — R109 Unspecified abdominal pain: Secondary | ICD-10-CM

## 2023-12-11 DIAGNOSIS — C61 Malignant neoplasm of prostate: Secondary | ICD-10-CM

## 2023-12-11 NOTE — Addendum Note (Signed)
 Addended by: Jae Skeet E on: 12/11/2023 08:34 AM   Modules accepted: Orders

## 2023-12-15 ENCOUNTER — Other Ambulatory Visit

## 2023-12-15 ENCOUNTER — Encounter: Payer: Self-pay | Admitting: Family Medicine

## 2023-12-15 DIAGNOSIS — R160 Hepatomegaly, not elsewhere classified: Secondary | ICD-10-CM

## 2023-12-15 DIAGNOSIS — C61 Malignant neoplasm of prostate: Secondary | ICD-10-CM

## 2023-12-15 DIAGNOSIS — K769 Liver disease, unspecified: Secondary | ICD-10-CM

## 2023-12-15 DIAGNOSIS — N281 Cyst of kidney, acquired: Secondary | ICD-10-CM | POA: Diagnosis not present

## 2023-12-15 MED ORDER — GADOBUTROL 1 MMOL/ML IV SOLN
8.3000 mL | Freq: Once | INTRAVENOUS | Status: AC | PRN
  Administered 2023-12-15: 8.3 mL via INTRAVENOUS

## 2023-12-16 ENCOUNTER — Ambulatory Visit: Payer: Self-pay | Admitting: Family Medicine

## 2024-01-06 DIAGNOSIS — Z79899 Other long term (current) drug therapy: Secondary | ICD-10-CM | POA: Diagnosis not present

## 2024-01-06 DIAGNOSIS — G20C Parkinsonism, unspecified: Secondary | ICD-10-CM | POA: Diagnosis not present

## 2024-01-13 DIAGNOSIS — L821 Other seborrheic keratosis: Secondary | ICD-10-CM | POA: Diagnosis not present

## 2024-01-13 DIAGNOSIS — D485 Neoplasm of uncertain behavior of skin: Secondary | ICD-10-CM | POA: Diagnosis not present

## 2024-01-13 DIAGNOSIS — Z85820 Personal history of malignant melanoma of skin: Secondary | ICD-10-CM | POA: Diagnosis not present

## 2024-01-13 DIAGNOSIS — D225 Melanocytic nevi of trunk: Secondary | ICD-10-CM | POA: Diagnosis not present

## 2024-02-10 DIAGNOSIS — R3912 Poor urinary stream: Secondary | ICD-10-CM | POA: Diagnosis not present

## 2024-02-10 DIAGNOSIS — R3911 Hesitancy of micturition: Secondary | ICD-10-CM | POA: Diagnosis not present

## 2024-02-10 DIAGNOSIS — R350 Frequency of micturition: Secondary | ICD-10-CM | POA: Diagnosis not present

## 2024-02-17 ENCOUNTER — Encounter: Payer: Self-pay | Admitting: Sports Medicine

## 2024-02-17 DIAGNOSIS — R498 Other voice and resonance disorders: Secondary | ICD-10-CM | POA: Insufficient documentation

## 2024-02-17 DIAGNOSIS — G20C Parkinsonism, unspecified: Secondary | ICD-10-CM | POA: Diagnosis not present

## 2024-02-17 DIAGNOSIS — R471 Dysarthria and anarthria: Secondary | ICD-10-CM | POA: Diagnosis not present

## 2024-03-16 ENCOUNTER — Encounter: Payer: Self-pay | Admitting: Family Medicine

## 2024-03-16 ENCOUNTER — Ambulatory Visit (INDEPENDENT_AMBULATORY_CARE_PROVIDER_SITE_OTHER): Admitting: Family Medicine

## 2024-03-16 VITALS — BP 128/75 | HR 67 | Ht 68.0 in | Wt 176.0 lb

## 2024-03-16 DIAGNOSIS — R197 Diarrhea, unspecified: Secondary | ICD-10-CM | POA: Insufficient documentation

## 2024-03-16 NOTE — Assessment & Plan Note (Addendum)
 Likely viral etiology. Seems to be improved some today.  He will continue to monitor and if not continuing ot improve will plan for stool studies.  Encouraged to stay well hydrated.

## 2024-03-16 NOTE — Progress Notes (Signed)
 Marc Reynolds - 73 y.o. male MRN [id number]  Date of birth: 06-10-1951  Subjective Chief Complaint  Patient presents with   Diarrhea   Tremors    HPI Marc Reynolds is a 73 y.o. male here today with complaint of diarrhea.  He has had watery diarrhea for about 1 week.  Having 4 loose stools per day.  He has not noticed blood in his stool.  He has had some mild abdominal cramping.  He denies .nausea, vomiting or appetite changes.  He has been afebrile.  No recent antibiotic treatment.  No recent medication changes.  He feels that symptoms were a little better and stool was slightly formed when he used the bathroom today.   ROS:  A comprehensive ROS was completed and negative except as noted per HPI  No Known Allergies  Past Medical History:  Diagnosis Date   Anxiety    Cancer [facility])    Prostate 2022   Depression    Neuromuscular disorder (HCC)    Parkinson's 2021   Varicose veins of legs     History reviewed. No pertinent surgical history.  Social History   Socioeconomic History   Marital status: Married    Spouse name: June   Number of children: 3   Years of education: 16   Highest education level: Manufacturing engineer (e.g., MA, MS, MEng, MEd, MSW, MBA)  Occupational History   Occupation: Retired  Tobacco Use   Smoking status: Never   Smokeless tobacco: Never  Vaping Use   Vaping status: Never Used  Substance and Sexual Activity   Alcohol use: Yes    Alcohol/week: 2.0 standard drinks of alcohol    Types: 1 Glasses of wine, 1 Cans of beer per week    Comment: Drink occasionally.   Drug use: Never   Sexual activity: Not Currently    Partners: Female    Birth control/protection: None  Other Topics Concern   Not on file  Social History Narrative   Lives with his wife and daughter (she has down syndrome). He enjoys being outdoors.    Social Drivers of Corporate investment banker Strain: Low Risk  (09/03/2023)   Overall Financial Resource Strain (CARDIA)    Difficulty of  Paying Living Expenses: Not hard at all  Food Insecurity: No Food Insecurity (09/03/2023)   Hunger Vital Sign    Worried About Running Out of Food in the Last Year: Never true    Ran Out of Food in the Last Year: Never true  Transportation Needs: No Transportation Needs (09/03/2023)   PRAPARE - Administrator, Civil Service (Medical): No    Lack of Transportation (Non-Medical): No  Physical Activity: Sufficiently Active (09/03/2023)   Exercise Vital Sign    Days of Exercise per Week: 5 days    Minutes of Exercise per Session: 30 min  Stress: Stress Concern Present (09/03/2023)   [employer] of Occupational Health - Occupational Stress Questionnaire    Feeling of Stress : To some extent  Social Connections: Socially Integrated (09/03/2023)   Social Connection and Isolation Panel    Frequency of Communication with Friends and Family: More than three times a week    Frequency of Social Gatherings with Friends and Family: Twice a week    Attends Religious Services: More than 4 times per year    Active Member of [employer] or Organizations: Yes    Attends Banker Meetings: More than 4 times per year    Marital Status:  Married    Family History  Problem Relation Age of Onset   Pancreatic cancer Mother    Hypertension Mother    Diabetes Mother    Cancer Mother    Hypertension Father    Alcohol abuse Father    Arthritis Father    Varicose Veins Father    Skin cancer Sister    Hypertension Brother    Cancer Maternal Grandmother    ADD / ADHD Son    ADD / ADHD Son    Learning disabilities Son    Birth defects Daughter    Intellectual disability Daughter    Learning disabilities Daughter    Obesity Daughter    Hypertension Brother     Health Maintenance  Topic Date Due   Hepatitis C Screening  Never done   COVID-19 Vaccine (3 - Moderna risk series) 03/09/2020   Pneumococcal Vaccine: 50+ Years (2 of 2 - PCV20 or PCV21) 12/13/2020   Influenza Vaccine   Never done   Medicare Annual Wellness (AWV)  09/02/2024   Fecal DNA (Cologuard)  04/06/2026   DTaP/Tdap/Td (3 - Td or Tdap) 10/08/2032   Zoster Vaccines- Shingrix  Completed   HPV VACCINES  Aged Out   Meningococcal B Vaccine  Aged Out     ----------------------------------------------------------------------------------------------------------------------------------------------------------------------------------------------------------------- Physical Exam BP (!) 146/88 (BP Location: Left Arm, Patient Position: Sitting, Cuff Size: Normal)   Pulse 67   Ht 5' 8 (1.727 m)   Wt 176 lb (79.8 kg)   SpO2 99%   BMI 26.76 kg/m   Physical Exam Constitutional:      Appearance: Normal appearance.  HENT:     Head: Normocephalic and atraumatic.  Eyes:     General: No scleral icterus. Cardiovascular:     Rate and Rhythm: Normal rate and regular rhythm.  Pulmonary:     Effort: Pulmonary effort is normal.     Breath sounds: Normal breath sounds.  Neurological:     Mental Status: He is alert.  Psychiatric:        Mood and Affect: Mood normal.        Behavior: Behavior normal.     ------------------------------------------------------------------------------------------------------------------------------------------------------------------------------------------------------------------- Assessment and Plan  Watery diarrhea Likely viral etiology. Seems to be improved some today.  He will continue to monitor and if not continuing ot improve will plan for stool studies.  Encouraged to stay well hydrated.     No orders of the defined types were placed in this encounter.   No follow-ups on file.

## 2024-03-16 NOTE — Patient Instructions (Signed)
 Stay well hydrated.  Let me know if not continuing to improve.

## 2024-03-19 ENCOUNTER — Encounter: Payer: Self-pay | Admitting: Podiatry

## 2024-03-19 ENCOUNTER — Ambulatory Visit (INDEPENDENT_AMBULATORY_CARE_PROVIDER_SITE_OTHER): Admitting: Podiatry

## 2024-03-19 DIAGNOSIS — D2372 Other benign neoplasm of skin of left lower limb, including hip: Secondary | ICD-10-CM | POA: Diagnosis not present

## 2024-03-19 DIAGNOSIS — B351 Tinea unguium: Secondary | ICD-10-CM

## 2024-03-19 MED ORDER — TERBINAFINE HCL 250 MG PO TABS: 250.0000 mg | ORAL_TABLET | Freq: Every day | ORAL | 0 refills | Status: AC

## 2024-03-19 NOTE — Progress Notes (Signed)
  Subjective:  Patient ID: Marc Reynolds, male    DOB: March 20, 1951,   MRN: [id number]  Chief Complaint  Patient presents with   Callouses    I have a sore spot on my left foot, it's a callus.    74 y.o. male presents for follow-up of  fungal nails. Relates the fungus has come back. SABRA He relates concern of pain returned on the left foot in area of lesion on the bottom of the foot. History of parkinson's.  . Denies any other pedal complaints. Denies n/v/f/c.   Past Medical History:  Diagnosis Date   Anxiety    Cancer [facility])    Prostate 2022   Depression    Neuromuscular disorder (HCC)    Parkinson's 2021   Varicose veins of legs     Objective:  Physical Exam: Vascular: DP/PT pulses 2/4 bilateral. CFT <3 seconds. Normal hair growth on digits. No edema.  Skin. No lacerations or abrasions bilateral feet. Nails 1-5 bilateral distally with subungual debris and thickness noted. Improved and 3/4 of nail cleared. Hyperkeratotic cored lesion noted to plantar left fifth metatarsal with disruption of skin lines.  Musculoskeletal: MMT 5/5 bilateral lower extremities in DF, PF, Inversion and Eversion. Deceased ROM in DF of ankle joint. Mildly tender over the anterior ankle joint line on the right as well as along the course of the peroneal tendon posterior to the lateral malleolus. Pain with PF and eversion and inversion. Mile pain with Rom of the ankle joint. No pain with ROM of the subtalar joint.  Neurological: Sensation intact to light touch.   Assessment:   1. Benign neoplasm of skin of foot, left   2. Onychomycosis         Plan:  Patient was evaluated and treated and all questions answered. X-rays reviewed and discussed with patient. No acute fractures or dislocations noted.  Some degnerative changes noted in right ankle and subtalar joint.   Discussed peroneal tendinitis and capuslitis of the ankle  and treatment options at length with patient Continue brace and strethcing and  anti-infalmmatories  Hyperkeratotic lesion debrided as courtesy today.  Fungus appears to be worsening. Lamisil  sent for 30 days.  Patient to return as needed    Asberry Failing, DPM

## 2024-03-22 ENCOUNTER — Encounter: Payer: Self-pay | Admitting: Family Medicine

## 2024-03-23 ENCOUNTER — Other Ambulatory Visit: Payer: Self-pay | Admitting: Family Medicine

## 2024-03-23 DIAGNOSIS — K5900 Constipation, unspecified: Secondary | ICD-10-CM

## 2024-03-23 DIAGNOSIS — R109 Unspecified abdominal pain: Secondary | ICD-10-CM

## 2024-03-24 ENCOUNTER — Ambulatory Visit

## 2024-03-24 DIAGNOSIS — R1032 Left lower quadrant pain: Secondary | ICD-10-CM

## 2024-03-24 DIAGNOSIS — R10A0 Flank pain, unspecified side: Secondary | ICD-10-CM

## 2024-03-24 DIAGNOSIS — R197 Diarrhea, unspecified: Secondary | ICD-10-CM | POA: Diagnosis not present

## 2024-03-24 DIAGNOSIS — K6389 Other specified diseases of intestine: Secondary | ICD-10-CM | POA: Diagnosis not present

## 2024-03-25 ENCOUNTER — Ambulatory Visit: Admitting: Podiatry

## 2024-06-16 ENCOUNTER — Other Ambulatory Visit: Payer: Self-pay | Admitting: Podiatry

## 2024-09-07 ENCOUNTER — Ambulatory Visit
